# Patient Record
Sex: Female | Born: 1970
Health system: Southern US, Community
[De-identification: ages and names within clinical notes are randomized; demographics above are authoritative.]

## PROBLEM LIST (undated history)

## (undated) DIAGNOSIS — N200 Calculus of kidney: Secondary | ICD-10-CM

## (undated) DIAGNOSIS — K219 Gastro-esophageal reflux disease without esophagitis: Secondary | ICD-10-CM

## (undated) DIAGNOSIS — K2970 Gastritis, unspecified, without bleeding: Secondary | ICD-10-CM

## (undated) DIAGNOSIS — F32A Depression, unspecified: Secondary | ICD-10-CM

## (undated) DIAGNOSIS — Z87898 Personal history of other specified conditions: Secondary | ICD-10-CM

## (undated) HISTORY — DX: Gastro-esophageal reflux disease without esophagitis: K21.9

## (undated) HISTORY — DX: Calculus of kidney: N20.0

## (undated) HISTORY — PX: BUNIONECTOMY: SHX129

## (undated) HISTORY — PX: AUGMENTATION MAMMAPLASTY: SUR837

## (undated) HISTORY — DX: Gastritis, unspecified, without bleeding: K29.70

## (undated) HISTORY — DX: Depression, unspecified: F32.A

---

## 1993-12-13 HISTORY — PX: LAPAROSCOPIC CHOLECYSTECTOMY: SUR755

## 1999-03-02 ENCOUNTER — Other Ambulatory Visit: Admission: RE | Admit: 1999-03-02 | Discharge: 1999-03-02 | Payer: Self-pay | Admitting: Obstetrics and Gynecology

## 1999-03-30 ENCOUNTER — Ambulatory Visit (HOSPITAL_COMMUNITY): Admission: RE | Admit: 1999-03-30 | Discharge: 1999-03-30 | Payer: Self-pay | Admitting: Gynecology

## 2000-06-09 ENCOUNTER — Encounter: Payer: Self-pay | Admitting: Gynecology

## 2000-06-09 ENCOUNTER — Encounter: Admission: RE | Admit: 2000-06-09 | Discharge: 2000-06-09 | Payer: Self-pay | Admitting: Gynecology

## 2000-08-19 ENCOUNTER — Inpatient Hospital Stay (HOSPITAL_COMMUNITY): Admission: AD | Admit: 2000-08-19 | Discharge: 2000-08-19 | Payer: Self-pay | Admitting: Gynecology

## 2000-10-06 ENCOUNTER — Inpatient Hospital Stay (HOSPITAL_COMMUNITY): Admission: AD | Admit: 2000-10-06 | Discharge: 2000-10-09 | Payer: Self-pay | Admitting: Gynecology

## 2002-06-21 ENCOUNTER — Encounter: Payer: Self-pay | Admitting: Family Medicine

## 2002-06-21 ENCOUNTER — Encounter: Admission: RE | Admit: 2002-06-21 | Discharge: 2002-06-21 | Payer: Self-pay | Admitting: Family Medicine

## 2004-10-17 ENCOUNTER — Emergency Department (HOSPITAL_COMMUNITY): Admission: EM | Admit: 2004-10-17 | Discharge: 2004-10-17 | Payer: Self-pay | Admitting: Family Medicine

## 2004-10-22 ENCOUNTER — Ambulatory Visit: Payer: Self-pay | Admitting: Gastroenterology

## 2004-11-10 ENCOUNTER — Ambulatory Visit: Payer: Self-pay | Admitting: Gastroenterology

## 2007-12-14 HISTORY — PX: BREAST ENHANCEMENT SURGERY: SHX7

## 2013-05-21 ENCOUNTER — Telehealth: Payer: Self-pay | Admitting: Obstetrics & Gynecology

## 2013-05-22 ENCOUNTER — Encounter: Payer: Self-pay | Admitting: Obstetrics & Gynecology

## 2013-05-22 ENCOUNTER — Ambulatory Visit (INDEPENDENT_AMBULATORY_CARE_PROVIDER_SITE_OTHER): Payer: BC Managed Care – PPO | Admitting: Obstetrics & Gynecology

## 2013-05-22 ENCOUNTER — Other Ambulatory Visit: Payer: Self-pay | Admitting: Obstetrics & Gynecology

## 2013-05-22 VITALS — BP 102/62 | HR 64 | Resp 12 | Ht 66.5 in | Wt 164.0 lb

## 2013-05-22 DIAGNOSIS — Z124 Encounter for screening for malignant neoplasm of cervix: Secondary | ICD-10-CM

## 2013-05-22 DIAGNOSIS — Z202 Contact with and (suspected) exposure to infections with a predominantly sexual mode of transmission: Secondary | ICD-10-CM

## 2013-05-22 DIAGNOSIS — Z23 Encounter for immunization: Secondary | ICD-10-CM

## 2013-05-22 DIAGNOSIS — Z01419 Encounter for gynecological examination (general) (routine) without abnormal findings: Secondary | ICD-10-CM

## 2013-05-22 DIAGNOSIS — Z0000 Encounter for general adult medical examination without abnormal findings: Secondary | ICD-10-CM

## 2013-05-22 DIAGNOSIS — Z2089 Contact with and (suspected) exposure to other communicable diseases: Secondary | ICD-10-CM

## 2013-05-22 LAB — POCT URINALYSIS DIPSTICK
Bilirubin, UA: NEGATIVE
Glucose, UA: NEGATIVE
Ketones, UA: NEGATIVE
Nitrite, UA: NEGATIVE
Urobilinogen, UA: NEGATIVE
pH, UA: 7

## 2013-05-22 NOTE — Progress Notes (Signed)
42 y.o. Shelly Blackwell DivorcedCaucasianF here for annual exam.  Cycles are regular.  Wants to do blood work today.  Did have an abnormal MMG in the past.  Dense breast with a cyst.  Follow-up was normal.  Hasn't had in about two years.   Patient's last menstrual period was 05/12/2013.          Sexually active: yes  The current method of family planning is vasectomy (current partner) Exercising: yes  walking Smoker:  yes  Health Maintenance: Pap:  11/12  History of abnormal Pap:  no  MMG:  11/12 Colonoscopy:  none BMD:   none TDaP:  ? Screening Labs: today, Urine today: WBC-trace, PROTEIN-trace, RBC-trace   reports that she has been smoking Cigarettes.  She has been smoking about 0.50 packs per day. She has never used smokeless tobacco. She reports that she does not drink alcohol or use illicit drugs.  Past Medical History  Diagnosis Date  . Kidney stones     h/o-no surgery    Past Surgical History  Procedure Laterality Date  . Cesarean section      x2  . Cholecystectomy    . Breast enhancement surgery    . Bunionectomy      left foot    Current Outpatient Prescriptions  Medication Sig Dispense Refill  . aspirin 81 MG tablet Take 81 mg by mouth daily.      . fish oil-omega-3 fatty acids 1000 MG capsule Take 2 g by mouth daily.      . Multiple Vitamins-Minerals (MULTIVITAMIN PO) Take by mouth daily.      . Probiotic Product (PROBIOTIC DAILY PO) Take by mouth daily.       No current facility-administered medications for this visit.    Family History  Problem Relation Age of Onset  . Hepatitis Mother   . Liver cancer Mother   . Cirrhosis Mother     liver transplant  . Kidney Stones Mother   . Cancer Paternal Grandmother     stomach  . Alcoholism Paternal Grandfather   . Alcoholism Maternal Grandfather     ROS:  Pertinent items are noted in HPI.  Otherwise, a comprehensive ROS was negative.  Exam:   BP 102/62  Pulse 64  Resp 12  Ht 5' 6.5" (1.689 m)  Wt 164 lb  (74.39 kg)  BMI 26.08 kg/m2  LMP 05/12/2013    Height: 5' 6.5" (168.9 cm)  Ht Readings from Last 3 Encounters:  05/22/13 5' 6.5" (1.689 m)    General appearance: alert, cooperative and appears stated age Head: Normocephalic, without obvious abnormality, atraumatic Neck: no adenopathy, supple, symmetrical, trachea midline and thyroid normal to inspection and palpation Lungs: clear to auscultation bilaterally Breasts: normal appearance, no masses or tenderness, bilateral implants Heart: regular rate and rhythm Abdomen: soft, non-tender; bowel sounds normal; no masses,  no organomegaly Extremities: extremities normal, atraumatic, no cyanosis or edema Skin: Skin color, texture, turgor normal. No rashes or lesions Lymph nodes: Cervical, supraclavicular, and axillary nodes normal. No abnormal inguinal nodes palpated Neurologic: Grossly normal   Pelvic: External genitalia:  no lesions              Urethra:  normal appearing urethra with no masses, tenderness or lesions              Bartholins and Skenes: normal                 Vagina: normal appearing vagina with normal color and discharge, no lesions  Cervix: no lesions              Pap taken: yes Bimanual Exam:  Uterus:  normal size, contour, position, consistency, mobility, non-tender              Adnexa: normal adnexa and no mass, fullness, tenderness               Rectovaginal: Confirms               Anus:  normal sphincter tone, no lesions  A:  Well Woman with normal exam Smoker Desires STD testing  P:   Mammogram recommended yearly.  3D d/w pt pap smear with hr HPV Gc/Chl and HIV FLP, CMP, TSH, Vit D Tdap given today return annually or prn  An After Visit Summary was printed and given to the patient.

## 2013-05-22 NOTE — Patient Instructions (Addendum)

## 2013-05-23 ENCOUNTER — Encounter: Payer: Self-pay | Admitting: Obstetrics & Gynecology

## 2013-05-23 ENCOUNTER — Other Ambulatory Visit: Payer: Self-pay

## 2013-05-23 DIAGNOSIS — Z1231 Encounter for screening mammogram for malignant neoplasm of breast: Secondary | ICD-10-CM

## 2013-05-23 LAB — LIPID PANEL
Cholesterol: 105 mg/dL (ref 0–200)
HDL: 50 mg/dL (ref >39)
LDL Cholesterol: 45 mg/dL (ref 0–99)
Triglycerides: 49 mg/dL (ref <150)
VLDL: 10 mg/dL (ref 0–40)

## 2013-05-23 LAB — COMPREHENSIVE METABOLIC PANEL
AST: 14 U/L (ref 0–37)
Albumin: 4.3 g/dL (ref 3.5–5.2)
Alkaline Phosphatase: 57 U/L (ref 39–117)
BUN: 16 mg/dL (ref 6–23)
CO2: 27 mEq/L (ref 19–32)
Chloride: 104 mEq/L (ref 96–112)
Creat: 0.76 mg/dL (ref 0.50–1.10)
Glucose, Bld: 92 mg/dL (ref 70–99)
Potassium: 4.1 mEq/L (ref 3.5–5.3)
Total Bilirubin: 0.4 mg/dL (ref 0.3–1.2)
Total Protein: 6.7 g/dL (ref 6.0–8.3)

## 2013-05-23 LAB — HIV ANTIBODY (ROUTINE TESTING W REFLEX): HIV: NONREACTIVE

## 2013-05-23 LAB — TSH: TSH: 1.123 u[IU]/mL (ref 0.350–4.500)

## 2013-05-23 LAB — VITAMIN D 25 HYDROXY (VIT D DEFICIENCY, FRACTURES): Vit D, 25-Hydroxy: 58 ng/mL (ref 30–89)

## 2013-05-23 NOTE — Addendum Note (Signed)
Addended by: Clide Dales R on: 05/23/2013 11:09 AM   Modules accepted: Orders

## 2013-05-24 ENCOUNTER — Telehealth: Payer: Self-pay

## 2013-05-24 NOTE — Telephone Encounter (Signed)
Message copied by Alisa Graff on Thu May 24, 2013  5:38 PM ------      Message from: Jerene Bears      Created: Wed May 23, 2013  4:00 PM       Inform labs nl. ------

## 2013-05-24 NOTE — Telephone Encounter (Signed)
6/12 lmtcb//kn

## 2013-05-24 NOTE — Telephone Encounter (Signed)
Patient notified of all results by Kandis Cocking, RN//kn

## 2013-05-24 NOTE — Telephone Encounter (Signed)
Patient notified of all lab results that are back at this point.  GC/Chl and pap still pending but we will call with these.

## 2013-05-25 LAB — IPS PAP TEST WITH HPV

## 2013-06-19 ENCOUNTER — Encounter (HOSPITAL_COMMUNITY): Payer: Self-pay | Admitting: Emergency Medicine

## 2013-06-19 ENCOUNTER — Emergency Department (HOSPITAL_COMMUNITY)
Admission: EM | Admit: 2013-06-19 | Discharge: 2013-06-20 | Disposition: A | Discharge status: Home / Self Care | Payer: BC Managed Care – PPO | Attending: Emergency Medicine | Admitting: Emergency Medicine

## 2013-06-19 DIAGNOSIS — R002 Palpitations: Secondary | ICD-10-CM | POA: Insufficient documentation

## 2013-06-19 DIAGNOSIS — I4949 Other premature depolarization: Secondary | ICD-10-CM | POA: Insufficient documentation

## 2013-06-19 DIAGNOSIS — I493 Ventricular premature depolarization: Secondary | ICD-10-CM

## 2013-06-19 DIAGNOSIS — H538 Other visual disturbances: Secondary | ICD-10-CM | POA: Insufficient documentation

## 2013-06-19 DIAGNOSIS — R42 Dizziness and giddiness: Secondary | ICD-10-CM | POA: Insufficient documentation

## 2013-06-19 DIAGNOSIS — F17200 Nicotine dependence, unspecified, uncomplicated: Secondary | ICD-10-CM | POA: Insufficient documentation

## 2013-06-19 DIAGNOSIS — R0602 Shortness of breath: Secondary | ICD-10-CM | POA: Insufficient documentation

## 2013-06-19 DIAGNOSIS — Z7982 Long term (current) use of aspirin: Secondary | ICD-10-CM | POA: Insufficient documentation

## 2013-06-19 DIAGNOSIS — Z87442 Personal history of urinary calculi: Secondary | ICD-10-CM | POA: Insufficient documentation

## 2013-06-19 LAB — POCT I-STAT, CHEM 8
Chloride: 104 mEq/L (ref 96–112)
Creatinine, Ser: 0.7 mg/dL (ref 0.50–1.10)
Glucose, Bld: 80 mg/dL (ref 70–99)
Potassium: 3.4 mEq/L — ABNORMAL LOW (ref 3.5–5.1)

## 2013-06-19 NOTE — ED Provider Notes (Signed)
History    CSN: [id number] Arrival date & time 06/19/13  1950  First MD Initiated Contact with Patient 06/19/13 2129     Chief Complaint  Patient presents with  . Palpitations   (Consider location/radiation/quality/duration/timing/severity/associated sxs/prior Treatment) HPI Pt is a 42yo female presenting today with new onset of palpitations that started yesterday, worsened this morning.  Pt describes palpitations as heart "stopping" then resuming very "abruptly associated with mild dizziness, lightheadedness, and mild change in vision.  Pt denies any known cardiac or pulmonary problems.  Pt reports typically drinking 3-4 cups of coffee/day however cut down to 1 cup today but was having symptoms prior to coffee earlier this morning.  Pt states she felt about 20 episodes in a period of palpitations earlier today.  Denies any new medications or recent stress in her life.    Past Medical History  Diagnosis Date  . Kidney stones     h/o-no surgery   Past Surgical History  Procedure Laterality Date  . Cesarean section  1994, 2001    x2  . Laparoscopic cholecystectomy  1995  . Breast enhancement surgery  2009  . Bunionectomy  1989, 1998    left foot   Family History  Problem Relation Age of Onset  . Hepatitis Mother   . Liver cancer Mother   . Cirrhosis Mother     liver transplant  . Kidney Stones Mother   . Cancer Paternal Grandmother     stomach  . Alcoholism Paternal Grandfather   . Alcoholism Maternal Grandfather    History  Substance Use Topics  . Smoking status: Light Tobacco Smoker -- 0.50 packs/day    Types: Cigarettes  . Smokeless tobacco: Never Used  . Alcohol Use: No   OB History   Grav Para Term Preterm Abortions TAB SAB Ect Mult Living   2 2 2       2      Review of Systems  Constitutional: Negative for chills, diaphoresis and fatigue.  Eyes: Positive for visual disturbance ( mild blurred). Negative for photophobia.  Respiratory: Positive for  shortness of breath. Negative for cough.   Cardiovascular: Positive for palpitations. Negative for chest pain.  Gastrointestinal: Negative for nausea, vomiting and diarrhea.  Neurological: Positive for light-headedness. Negative for dizziness and headaches.  All other systems reviewed and are negative.    Allergies  Nickel  Home Medications   Current Outpatient Rx  Name  Route  Sig  Dispense  Refill  . aspirin 81 MG tablet   Oral   Take 81 mg by mouth daily.         . fish oil-omega-3 fatty acids 1000 MG capsule   Oral   Take 2 g by mouth daily.         . Multiple Vitamins-Minerals (MULTIVITAMIN PO)   Oral   Take by mouth daily.         . Probiotic Product (PROBIOTIC DAILY PO)   Oral   Take by mouth daily.          BP 106/67  Pulse 60  Temp(Src) 98.4 F (36.9 C)  Resp 22  SpO2 98%  LMP 06/02/2013 Physical Exam  Nursing note and vitals reviewed. Constitutional: She appears well-developed and well-nourished. No distress.  Pt sitting comfortably in exam bed, NAD.    HENT:  Head: Normocephalic and atraumatic.  Eyes: Conjunctivae are normal. No scleral icterus.  Neck: Normal range of motion. Neck supple.  Cardiovascular: Normal rate, regular rhythm and normal  heart sounds.   Pulmonary/Chest: Effort normal and breath sounds normal. No respiratory distress. She has no wheezes. She has no rales. She exhibits no tenderness.  Abdominal: Soft. Bowel sounds are normal. She exhibits no distension and no mass. There is no tenderness. There is no rebound and no guarding.  Musculoskeletal: Normal range of motion.  Neurological: She is alert.  Skin: Skin is warm and dry. She is not diaphoretic.    ED Course  Procedures (including critical care time) Labs Reviewed  POCT I-STAT, CHEM 8 - Abnormal; Notable for the following:    Potassium 3.4 (*)    All other components within normal limits   No results found.   Date: 06/19/2013  Rate: 91  Rhythm: normal sinus  rhythm  QRS Axis: normal  Intervals: normal  ST/T Wave abnormalities: nonspecific ST changes  Conduction Disutrbances:none  Narrative Interpretation:   Old EKG Reviewed: none available   1. Heart palpitations   2. PVC (premature ventricular contraction)     MDM  Likely benign palpitations will likely have pt f/u with cardiology  Consulted Dr. Jon Billings with [facility] who agreed to have pt seen in office tomorrow morning to have heart monitor placed. Advised pt to call tomorrow afternoon if she still has not heard from office. Return precautions given. Pt verbalized understanding and agreement with tx plan. Vitals: unremarkable. Discharged in stable condition.    Discussed pt with attending during ED encounter.      Junius Finner, PA-C 06/20/13 0120

## 2013-06-19 NOTE — ED Notes (Signed)
PT. REPORTS INTERMITTENT PALPITATIONS ONSET YESTERDAY AND AGAIN THIS MORNING WITH SLIGHT SOB  AND MILD DIZZINESS , DENIES CHEST PAIN , NO NAUSEA OR DIAPHORESIS.

## 2013-06-20 NOTE — ED Provider Notes (Signed)
Medical screening examination/treatment/procedure(s) were conducted as a shared visit with non-physician practitioner(s) and myself.  I personally evaluated the patient during the encounter  Delta Deshmukh, MD 06/20/13 0145 

## 2013-06-20 NOTE — ED Provider Notes (Signed)
Patient reports feeling extra heartbeats onset yesterday with slight chest discomfort. And lightheadedness. She has cut back on her caffeine intake since yesterday. She feels much improved presently. Extra heartbeats lasts a split second at a time. Patient noted to have PVCs on the cardiac monitor while in the ED, which she recognizes and can feel.  Doug Sou, MD 06/20/13 [phone]

## 2013-06-21 ENCOUNTER — Encounter (INDEPENDENT_AMBULATORY_CARE_PROVIDER_SITE_OTHER): Payer: BC Managed Care – PPO

## 2013-06-21 ENCOUNTER — Other Ambulatory Visit: Payer: Self-pay | Admitting: Nurse Practitioner

## 2013-06-21 ENCOUNTER — Encounter: Payer: Self-pay | Admitting: *Deleted

## 2013-06-21 DIAGNOSIS — I493 Ventricular premature depolarization: Secondary | ICD-10-CM

## 2013-06-21 DIAGNOSIS — R002 Palpitations: Secondary | ICD-10-CM

## 2013-06-21 DIAGNOSIS — I4949 Other premature depolarization: Secondary | ICD-10-CM

## 2013-06-21 NOTE — Progress Notes (Signed)
Patient ID: Shelly Blackwell, female   DOB: 1971/07/04, 42 y.o.   MRN: [id number] E-Cardio verite 30 day event monitor applied to patient.

## 2013-06-29 ENCOUNTER — Ambulatory Visit
Admission: RE | Admit: 2013-06-29 | Discharge: 2013-06-29 | Disposition: A | Discharge status: Home / Self Care | Payer: BC Managed Care – PPO | Source: Ambulatory Visit

## 2013-06-29 DIAGNOSIS — Z1231 Encounter for screening mammogram for malignant neoplasm of breast: Secondary | ICD-10-CM

## 2013-07-06 ENCOUNTER — Other Ambulatory Visit: Payer: Self-pay | Admitting: Obstetrics & Gynecology

## 2013-07-06 DIAGNOSIS — R928 Other abnormal and inconclusive findings on diagnostic imaging of breast: Secondary | ICD-10-CM

## 2013-07-09 ENCOUNTER — Encounter: Payer: Self-pay | Admitting: Obstetrics & Gynecology

## 2013-07-23 ENCOUNTER — Ambulatory Visit
Admission: RE | Admit: 2013-07-23 | Discharge: 2013-07-23 | Disposition: A | Discharge status: Home / Self Care | Payer: BC Managed Care – PPO | Source: Ambulatory Visit | Attending: Obstetrics & Gynecology | Admitting: Obstetrics & Gynecology

## 2013-07-23 DIAGNOSIS — R928 Other abnormal and inconclusive findings on diagnostic imaging of breast: Secondary | ICD-10-CM

## 2013-08-09 ENCOUNTER — Telehealth: Payer: Self-pay | Admitting: *Deleted

## 2013-08-09 NOTE — Telephone Encounter (Signed)
Pt called about follow up after wearing 30 day monitor. Appointment made for f/u with Dr Graciela Husbands on 08/20/13

## 2013-08-16 ENCOUNTER — Encounter: Payer: Self-pay | Admitting: *Deleted

## 2013-08-20 ENCOUNTER — Institutional Professional Consult (permissible substitution): Payer: BC Managed Care – PPO | Admitting: Internal Medicine

## 2013-09-14 ENCOUNTER — Ambulatory Visit (INDEPENDENT_AMBULATORY_CARE_PROVIDER_SITE_OTHER): Payer: BC Managed Care – PPO | Admitting: Internal Medicine

## 2013-09-14 ENCOUNTER — Encounter: Payer: Self-pay | Admitting: Internal Medicine

## 2013-09-14 VITALS — BP 121/80 | HR 70 | Ht 67.0 in | Wt 154.0 lb

## 2013-09-14 DIAGNOSIS — I493 Ventricular premature depolarization: Secondary | ICD-10-CM | POA: Insufficient documentation

## 2013-09-14 DIAGNOSIS — R002 Palpitations: Secondary | ICD-10-CM

## 2013-09-14 DIAGNOSIS — I4949 Other premature depolarization: Secondary | ICD-10-CM

## 2013-09-14 MED ORDER — VERAPAMIL HCL 40 MG PO TABS: ORAL_TABLET | ORAL | Status: DC

## 2013-09-14 NOTE — Patient Instructions (Addendum)
Your physicain has recommended that you have a signal average EKG  Your physician has requested that you have an exercise tolerance test. For further information please visit [email or web address] Please also follow instruction sheet, as given.   (with Rick Duff PAC or Tereso Newcomer [facility])  Your physician has requested that you have an echocardiogram. Echocardiography is a painless test that uses sound waves to create images of your heart. It provides your doctor with information about the size and shape of your heart and how well your heart's chambers and valves are working. This procedure takes approximately one hour. There are no restrictions for this procedure.   Your physician has recommended you make the following change in your medication:  1) Start Verapamil 40mg  - take as needed for palpitations, may repeat in 30 minutes times one time

## 2013-09-14 NOTE — Progress Notes (Signed)
ELECTROPHYSIOLOGY CONSULT NOTE  Patient ID: Shelly Blackwell, MRN: [id number], DOB/AGE: [birth year] 42 y.o. Admit date: (Not on file) Date of Consult: 09/14/2013  Primary Physician: No PCP Per Patient Primary Cardiologist: new  Chief Complaint:  PVCs   HPI Shelly Blackwell is a 42 y.o. female  Referred from the emergency room because of palpitations which she diagnosed on her own correctly as PVCs and which were corroborated by monitoring arranged following her visit to the emergency room earlier this summer. She has symptoms of palpitations with these and in particular some lightheadedness particularly with bigeminy.  She has noted no problems with exercise tolerance.  She has noted no significant association with her caffeine use. There was some modest stress associated with the events.  She has reasonable exercise tolerance. She is not currently exercising. she smokes.     Past Medical History  Diagnosis Date  . Kidney stones     h/o-no surgery      Surgical History:  Past Surgical History  Procedure Laterality Date  . Cesarean section  1994, 2001    x2  . Laparoscopic cholecystectomy  1995  . Breast enhancement surgery  2009  . Bunionectomy  1989, 1998    left foot     Home Meds: Prior to Admission medications   Medication Sig Start Date End Date Taking? Authorizing Provider  fish oil-omega-3 fatty acids 1000 MG capsule Take 2 g by mouth daily.   Yes Historical Provider, MD  Multiple Vitamins-Minerals (MULTIVITAMIN PO) Take by mouth daily.   Yes Historical Provider, MD  Probiotic Product (PROBIOTIC DAILY PO) Take by mouth daily.   Yes Historical Provider, MD     Allergies:  Allergies  Allergen Reactions  . Nickel     unknown    History   Social History  . Marital Status: Divorced    Spouse Name: N/A    Number of Children: N/A  . Years of Education: N/A   Occupational History  . Not on file.   Social History Main Topics  . Smoking status: Light  Tobacco Smoker -- 0.50 packs/day    Types: Cigarettes  . Smokeless tobacco: Never Used  . Alcohol Use: No  . Drug Use: No  . Sexual Activity: Not on file     Comment: vasectomy   Other Topics Concern  . Not on file   Social History Narrative  . No narrative on file     Family History  Problem Relation Age of Onset  . Hepatitis Mother   . Liver cancer Mother   . Cirrhosis Mother     liver transplant  . Kidney Stones Mother   . Cancer Paternal Grandmother     stomach  . Alcoholism Paternal Grandfather   . Alcoholism Maternal Grandfather      ROS:  Please see the history of present illness.     All other systems reviewed and negative.    Physical Exam: BP 121/80  Pulse 70  Ht 5\' 7"  (1.702 m)  Wt 154 lb (69.854 kg)  BMI 24.11 kg/m2  Alert and oriented in no acute distress HENT- normal Eyes- EOMI, without scleral icterus Skin- warm and dry; without rashes LN-neg Neck- supple without thyromegaly, JVP-flat, carotids brisk and full without bruits Back-without CVAT or kyphosis Lungs-clear to auscultation CV-Regular rate and rhythm, nl S1 and S2, no murmurs gallops or rubs, S4-absent Abd-soft with active bowel sounds; no midline pulsation or hepatomegaly Pulses-intact femoral and distal MKS-without gross deformity Neuro- Ax O,  CN3-12 intact, grossly normal motor and sensory function Affect engaging    Labs: Cardiac Enzymes No results found for this basename: CKTOTAL, CKMB, TROPONINI,  in the last 72 hours CBC Lab Results  Component Value Date   HGB 12.6 06/19/2013   HCT 37.0 06/19/2013    EKG:  Sinus rhythm with normal intervals and no T wave changes Monitor demonstrated PVCs which had been axis perpendicular to her lead 2/V6  suggesting either in origin in the left ventricle or in the inferior part of the heart.    Assessment and Plan:    Shelly Blackwell

## 2013-09-14 NOTE — Assessment & Plan Note (Signed)
The patient has a long-standing history of symptomatic PVCs that recently have worsened area they have gradually resolved again. The morphology suggests an origin from the inferior wall or the left ventricle. Given her atypical morphology, we'll undertake an evaluation to exclude structural heart disease. We will pursue a exercise test, signal average ECG and 2-D echo.  In addition we have given a prescription for verapamil to take on an as-needed basis, using verapamil because of the expected site of origin

## 2013-09-17 ENCOUNTER — Other Ambulatory Visit: Payer: Self-pay | Admitting: *Deleted

## 2013-09-17 ENCOUNTER — Encounter: Payer: Self-pay | Admitting: Internal Medicine

## 2013-09-17 MED ORDER — VERAPAMIL HCL 40 MG PO TABS: ORAL_TABLET | ORAL | Status: DC

## 2013-09-19 ENCOUNTER — Ambulatory Visit (HOSPITAL_COMMUNITY)
Admission: RE | Admit: 2013-09-19 | Discharge: 2013-09-19 | Disposition: A | Discharge status: Home / Self Care | Payer: BC Managed Care – PPO | Source: Ambulatory Visit | Attending: Internal Medicine | Admitting: Internal Medicine

## 2013-09-19 DIAGNOSIS — I4949 Other premature depolarization: Secondary | ICD-10-CM | POA: Insufficient documentation

## 2013-10-01 ENCOUNTER — Encounter (INDEPENDENT_AMBULATORY_CARE_PROVIDER_SITE_OTHER): Payer: Self-pay | Admitting: Surgery

## 2013-10-01 ENCOUNTER — Ambulatory Visit (INDEPENDENT_AMBULATORY_CARE_PROVIDER_SITE_OTHER): Payer: BC Managed Care – PPO | Admitting: Surgery

## 2013-10-01 VITALS — BP 100/60 | HR 66 | Temp 98.6°F | Resp 14 | Ht 67.0 in | Wt 152.0 lb

## 2013-10-01 DIAGNOSIS — K432 Incisional hernia without obstruction or gangrene: Secondary | ICD-10-CM | POA: Insufficient documentation

## 2013-10-01 DIAGNOSIS — Z720 Tobacco use: Secondary | ICD-10-CM | POA: Insufficient documentation

## 2013-10-01 DIAGNOSIS — F17200 Nicotine dependence, unspecified, uncomplicated: Secondary | ICD-10-CM

## 2013-10-01 NOTE — Progress Notes (Signed)
Subjective:     Patient ID: Shelly Blackwell, female   DOB: 12-Nov-1971, 42 y.o.   MRN: [id number]  HPI  Shelly Blackwell  [birth year] [id number]  Patient Care Team: No Pcp Per Patient as PCP - General (General Practice)  This patient is a 42 y.o.female who presents today for surgical evaluation at the request of self.   Reason for visit: Umbilical hernia  Woman with history of PVCs followed by cardiology.  Smokes 1/2 PPD.  Has noticed a lump at her bellybutton for the past season.  Worse with cough/Valsalva.  Not severely painful, but can be tender at times.  Worse with sit-ups and moderate exercise.  It concerned her.  She wished to be evaluated.  Had a laparoscopic cholecystectomy in the 1990s.  Exercises regularly.    No exertional chest/neck/shoulder/arm pain.  Patient can walk 120 minutes for about 5 miles without difficulty.   BM daily.  No history of MRSA or other skin infections  Patient Active Problem List   Diagnosis Date Noted  . PVC's (premature ventricular contractions) 09/14/2013    Past Medical History  Diagnosis Date  . Kidney stones     h/o-no surgery    Past Surgical History  Procedure Laterality Date  . Cesarean section  1994, 2001    x2  . Laparoscopic cholecystectomy  1995  . Breast enhancement surgery  2009  . Bunionectomy  1989, 1998    left foot    History   Social History  . Marital Status: Divorced    Spouse Name: N/A    Number of Children: N/A  . Years of Education: N/A   Occupational History  . Not on file.   Social History Main Topics  . Smoking status: Light Tobacco Smoker -- 0.50 packs/day    Types: Cigarettes  . Smokeless tobacco: Never Used  . Alcohol Use: No  . Drug Use: No  . Sexual Activity: Not on file     Comment: vasectomy   Other Topics Concern  . Not on file   Social History Narrative  . No narrative on file    Family History  Problem Relation Age of Onset  . Hepatitis Mother   . Liver cancer Mother   .  Cirrhosis Mother     liver transplant  . Kidney Stones Mother   . Cancer Paternal Grandmother     stomach  . Alcoholism Paternal Grandfather   . Alcoholism Maternal Grandfather     Current Outpatient Prescriptions  Medication Sig Dispense Refill  . fish oil-omega-3 fatty acids 1000 MG capsule Take 2 g by mouth daily.      . Multiple Vitamins-Minerals (MULTIVITAMIN PO) Take by mouth daily.      . Probiotic Product (PROBIOTIC DAILY PO) Take by mouth daily.      . verapamil (CALAN) 40 MG tablet Take one tablet as needed for palpitations. Can repeat in 30 minutes x once.  20 tablet  0   No current facility-administered medications for this visit.     Allergies  Allergen Reactions  . Nickel     unknown    BP 100/60  Pulse 66  Temp(Src) 98.6 F (37 C) (Temporal)  Resp 14  Ht 5\' 7"  (1.702 m)  Wt 152 lb (68.947 kg)  BMI 23.8 kg/m2  No results found.   Review of Systems  Constitutional: Negative for fever, chills, diaphoresis, appetite change and fatigue.  HENT: Negative for ear discharge, ear pain, sore throat and trouble swallowing.  Eyes: Negative for photophobia, discharge and visual disturbance.  Respiratory: Negative for cough, choking, chest tightness and shortness of breath.   Cardiovascular: Negative for chest pain and palpitations.  Gastrointestinal: Negative for nausea, vomiting, abdominal pain, diarrhea, constipation, anal bleeding and rectal pain.  Endocrine: Negative for cold intolerance and heat intolerance.  Genitourinary: Negative for dysuria, frequency and difficulty urinating.  Musculoskeletal: Negative for gait problem, myalgias and neck pain.  Skin: Negative for color change, pallor and rash.  Allergic/Immunologic: Negative for environmental allergies, food allergies and immunocompromised state.  Neurological: Negative for dizziness, speech difficulty, weakness and numbness.  Hematological: Negative for adenopathy.  Psychiatric/Behavioral: Negative for  confusion and agitation. The patient is not nervous/anxious.        Objective:   Physical Exam  Constitutional: She is oriented to person, place, and time. She appears well-developed and well-nourished. No distress.  HENT:  Head: Normocephalic.  Mouth/Throat: Oropharynx is clear and moist. No oropharyngeal exudate.  Eyes: Conjunctivae and EOM are normal. Pupils are equal, round, and reactive to light. No scleral icterus.  Neck: Normal range of motion. Neck supple. No tracheal deviation present.  Cardiovascular: Normal rate, regular rhythm and intact distal pulses.   Pulmonary/Chest: Effort normal and breath sounds normal. No stridor. No respiratory distress. She exhibits no tenderness.  Abdominal: Soft. She exhibits no distension and no mass. There is no rigidity, no rebound, no guarding, no tenderness at McBurney's point and negative Murphy's sign. A hernia is present. Hernia confirmed positive in the ventral area. Hernia confirmed negative in the right inguinal area and confirmed negative in the left inguinal area.    Genitourinary: No vaginal discharge found.  Musculoskeletal: Normal range of motion. She exhibits no tenderness.       Right elbow: She exhibits normal range of motion.       Left elbow: She exhibits normal range of motion.       Right wrist: She exhibits normal range of motion.       Left wrist: She exhibits normal range of motion.       Right hand: Normal strength noted.       Left hand: Normal strength noted.  Lymphadenopathy:       Head (right side): No posterior auricular adenopathy present.       Head (left side): No posterior auricular adenopathy present.    She has no cervical adenopathy.    She has no axillary adenopathy.       Right: No inguinal adenopathy present.       Left: No inguinal adenopathy present.  Neurological: She is alert and oriented to person, place, and time. No cranial nerve deficit. She exhibits normal muscle tone. Coordination normal.    Skin: Skin is warm and dry. No rash noted. She is not diaphoretic. No erythema.  Psychiatric: She has a normal mood and affect. Her behavior is normal. Judgment and thought content normal.       Assessment:     Incisional hernia at umbilicus.     Plan:     I think this is small enough that She has options.   I offered observation only Since or risk of incarceration a small given its small size.  Because it is getting more symptomatic, surgical repair is reasonable.  I think that all she requires is a primary repair with stitches.  Because it is rather sensitive at times, she wishes to deal with that now while and is small:  The anatomy & physiology of the  abdominal wall was discussed.  The pathophysiology of hernias was discussed.  Natural history risks without surgery including progeressive enlargement, pain, incarceration & strangulation was discussed.   Contributors to complications such as smoking, obesity, diabetes, prior surgery, etc were discussed.   I feel the risks of no intervention will lead to serious problems that outweigh the operative risks; therefore, I recommended surgery to reduce and repair the hernia.  I explained laparoscopic techniques with probable need for an open approach.  I noted the probable use of mesh to patch and/or buttress the hernia repair  Risks such as bleeding, infection, abscess, need for further treatment, heart attack, death, and other risks were discussed.  I noted a good likelihood this will help address the problem.   Goals of post-operative recovery were discussed as well.  Possibility that this will not correct all symptoms was explained.  I stressed the importance of low-impact activity, aggressive pain control, avoiding constipation, & not pushing through pain to minimize risk of post-operative chronic pain or injury. Possibility of reherniation especially with smoking, obesity, diabetes, immunosuppression, and other health conditions was discussed.   We will work to minimize complications.     An educational handout further explaining the pathology & treatment options was given as well.  Questions were answered.  The patient expresses understanding & wishes to proceed with surgery.  Stop smoking.  We talked to the patient about the dangers of smoking.  We stressed that tobacco use dramatically increases the risk of peri-operative complications such as infection, tissue necrosis leaving to problems with incision/wound and organ healing, heart attack, stroke, DVT, pulmonary embolism, and death.  We noted there are programs in our community to help stop smoking.

## 2013-10-01 NOTE — Patient Instructions (Signed)
See the Handout(s) we gave you.  Consider surgery To fix the abdominal wall hernia at your bellybutton.  Please call our office at [phone] if you wish to schedule surgery or if you have further questions / concerns.   HERNIA REPAIR: POST OP INSTRUCTIONS  1. DIET: Follow a light bland diet the first 24 hours after arrival home, such as soup, liquids, crackers, etc.  Be sure to include lots of fluids daily.  Avoid fast food or heavy meals as your are more likely to get nauseated.  Eat a low fat the next few days after surgery. 2. Take your usually prescribed home medications unless otherwise directed. 3. PAIN CONTROL: a. Pain is best controlled by a usual combination of three different methods TOGETHER: i. Ice/Heat ii. Over the counter pain medication iii. Prescription pain medication b. Most patients will experience some swelling and bruising around the hernia(s) such as the bellybutton, groins, or old incisions.  Ice packs or heating pads (30-60 minutes up to 6 times a day) will help. Use ice for the first few days to help decrease swelling and bruising, then switch to heat to help relax tight/sore spots and speed recovery.  Some people prefer to use ice alone, heat alone, alternating between ice & heat.  Experiment to what works for you.  Swelling and bruising can take several weeks to resolve.   c. It is helpful to take an over-the-counter pain medication regularly for the first few weeks.  Choose one of the following that works best for you: i. Naproxen (Aleve, etc)  Two 220mg  tabs twice a day ii. Ibuprofen (Advil, etc) Three 200mg  tabs four times a day (every meal & bedtime) iii. Acetaminophen (Tylenol, etc) 325-650mg  four times a day (every meal & bedtime) d. A  prescription for pain medication should be given to you upon discharge.  Take your pain medication as prescribed.  i. If you are having problems/concerns with the prescription medicine (does not control pain, nausea, vomiting,  rash, itching, etc), please call us [phone] to see if we need to switch you to a different pain medicine that will work better for you and/or control your side effect better. ii. If you need a refill on your pain medication, please contact your pharmacy.  They will contact our office to request authorization. Prescriptions will not be filled after 5 pm or on week-ends. 4. Avoid getting constipated.  Between the surgery and the pain medications, it is common to experience some constipation.  Increasing fluid intake and taking a fiber supplement (such as Metamucil, Citrucel, FiberCon, MiraLax, etc) 1-2 times a day regularly will usually help prevent this problem from occurring.  A mild laxative (prune juice, Milk of Magnesia, MiraLax, etc) should be taken according to package directions if there are no bowel movements after 48 hours.   5. Wash / shower every day.  You may shower over the dressings as they are waterproof.   6. Remove your waterproof bandages 5 days after surgery.  You may leave the incision open to air.  You may replace a dressing/Band-Aid to cover the incision for comfort if you wish.  Continue to shower over incision(s) after the dressing is off.    7. ACTIVITIES as tolerated:   a. You may resume regular (light) daily activities beginning the next day-such as daily self-care, walking, climbing stairs-gradually increasing activities as tolerated.  If you can walk 30 minutes without difficulty, it is safe to try more intense activity such as jogging,  treadmill, bicycling, low-impact aerobics, swimming, etc. b. Save the most intensive and strenuous activity for last such as sit-ups, heavy lifting, contact sports, etc  Refrain from any heavy lifting or straining until you are off narcotics for pain control.   c. DO NOT PUSH THROUGH PAIN.  Let pain be your guide: If it hurts to do something, don't do it.  Pain is your body warning you to avoid that activity for another week until the  pain goes down. d. You may drive when you are no longer taking prescription pain medication, you can comfortably wear a seatbelt, and you can safely maneuver your car and apply brakes. e. Bonita Quin may have sexual intercourse when it is comfortable.  8. FOLLOW UP in our office a. Please call [facility] at [phone] to set up an appointment to see your surgeon in the office for a follow-up appointment approximately 2-3 weeks after your surgery. b. Make sure that you call for this appointment the day you arrive home to insure a convenient appointment time. 9.  IF YOU HAVE DISABILITY OR FAMILY LEAVE FORMS, BRING THEM TO THE OFFICE FOR PROCESSING.  DO NOT GIVE THEM TO YOUR DOCTOR.  WHEN TO CALL us [phone]: 1. Poor pain control 2. Reactions / problems with new medications (rash/itching, nausea, etc)  3. Fever over 101.5 F (38.5 C) 4. Inability to urinate 5. Nausea and/or vomiting 6. Worsening swelling or bruising 7. Continued bleeding from incision. 8. Increased pain, redness, or drainage from the incision   The clinic staff is available to answer your questions during regular business hours (8:30am-5pm).  Please don't hesitate to call and ask to speak to one of our nurses for clinical concerns.   If you have a medical emergency, go to the nearest emergency room or call 911.  A surgeon from [facility] is always on call at the hospitals in [facility], [place] [address], [place], [place]  [address] ?  [address] [address], [place], [place]   [address] MAIN: [phone] ? TOLL FREE: [phone] ? FAX: [phone] [email or web address]  STOP SMOKING!  We strongly recommend that you stop smoking.  Smoking increases the risk of surgery including infection in the form of an open wound, pus formation, abscess, hernia at an incision on the abdomen, etc.  You have an increased risk of other MAJOR complications such as stroke, heart attack,  forming clots in the leg and/or lungs, and death.    While it can be one of the most difficult things to do, the [place] community has programs to help you stop.  Consider talking with your primary care physician about options.  Also, Smoking Cessation classes are available through the [facility]:  The smoking cessation program is a proven-effective program from the American Lung Association. The program is available for anyone 78 and older who currently smokes. The program lasts for 7 weeks and is 8 sessions. Each class will be approximately 1 1/2 hours. The program is every Tuesday.  All classes are 12-1:30pm and same location.  Event Location Information:  Location: [facility] 2nd Floor Conference Room 2-037; located next to [facility] cross streets: [address] & [facility] Entrance into the [facility] is adjacent to the [facility] main entrance. The conference room is located on the 2nd floor.  Parking Instructions: Visitor parking is adjacent to [employer] main entrance and the Cancer  Center   Call [phone] or check the Classes and Support Groups   [email or web address] the event of inclemet weather please call [phone] or view online at [email or web address]  Hernia A hernia occurs when an internal organ pushes out through a weak spot in the abdominal wall. Hernias most commonly occur in the groin and around the navel. Hernias often can be pushed back into place (reduced). Most hernias tend to get worse over time. Some abdominal hernias can get stuck in the opening (irreducible or incarcerated hernia) and cannot be reduced. An irreducible abdominal hernia which is tightly squeezed into the opening is at risk for impaired blood supply (strangulated hernia). A strangulated hernia is a medical emergency. Because of the risk for an irreducible or strangulated hernia, surgery may  be recommended to repair a hernia. CAUSES   Heavy lifting.  Prolonged coughing.  Straining to have a bowel movement.  A cut (incision) made during an abdominal surgery. HOME CARE INSTRUCTIONS   Bed rest is not required. You may continue your normal activities.  Avoid lifting more than 10 pounds (4.5 kg) or straining.  Cough gently. If you are a smoker it is best to stop. Even the best hernia repair can break down with the continual strain of coughing. Even if you do not have your hernia repaired, a cough will continue to aggravate the problem.  Do not wear anything tight over your hernia. Do not try to keep it in with an outside bandage or truss. These can damage abdominal contents if they are trapped within the hernia sac.  Eat a normal diet.  Avoid constipation. Straining over long periods of time will increase hernia size and encourage breakdown of repairs. If you cannot do this with diet alone, stool softeners may be used. SEEK IMMEDIATE MEDICAL CARE IF:   You have a fever.  You develop increasing abdominal pain.  You feel nauseous or vomit.  Your hernia is stuck outside the abdomen, looks discolored, feels hard, or is tender.  You have any changes in your bowel habits or in the hernia that are unusual for you.  You have increased pain or swelling around the hernia.  You cannot push the hernia back in place by applying gentle pressure while lying down. MAKE SURE YOU:   Understand these instructions.  Will watch your condition.  Will get help right away if you are not doing well or get worse. Document Released: 11/29/2005 Document Revised: 02/21/2012 Document Reviewed: 07/18/2008 [facility] Patient Information 2014 [place], [place].

## 2013-10-02 ENCOUNTER — Other Ambulatory Visit: Payer: Self-pay

## 2013-10-02 ENCOUNTER — Ambulatory Visit (HOSPITAL_COMMUNITY): Payer: BC Managed Care – PPO | Attending: Cardiology

## 2013-10-02 DIAGNOSIS — I472 Ventricular tachycardia: Secondary | ICD-10-CM

## 2013-10-02 DIAGNOSIS — I493 Ventricular premature depolarization: Secondary | ICD-10-CM

## 2013-10-02 DIAGNOSIS — R002 Palpitations: Secondary | ICD-10-CM

## 2013-10-10 ENCOUNTER — Telehealth: Payer: Self-pay | Admitting: Internal Medicine

## 2013-10-10 NOTE — Telephone Encounter (Signed)
New problem    Please call pt with results of Echo.   w # [phone] 4 pm  After  Cell # [phone]  Thanks!

## 2013-10-10 NOTE — Telephone Encounter (Signed)
See echo result note 

## 2013-10-15 ENCOUNTER — Ambulatory Visit (INDEPENDENT_AMBULATORY_CARE_PROVIDER_SITE_OTHER): Payer: BC Managed Care – PPO | Admitting: Physician Assistant

## 2013-10-15 DIAGNOSIS — I4949 Other premature depolarization: Secondary | ICD-10-CM

## 2013-10-15 DIAGNOSIS — I493 Ventricular premature depolarization: Secondary | ICD-10-CM

## 2013-10-15 DIAGNOSIS — R002 Palpitations: Secondary | ICD-10-CM

## 2013-10-15 NOTE — Progress Notes (Signed)
Exercise Treadmill Test  Pre-Exercise Testing Evaluation Rhythm: normal sinus  Rate: 82 bpm     Test  Exercise Tolerance Test Ordering MD: Sherryl Manges, MD  Interpreting MD: Tereso Newcomer PA-C  Unique Test No: 1  Treadmill:  1  Indication for ETT: palpitations  Contraindication to ETT: No   Stress Modality: exercise - treadmill  Cardiac Imaging Performed: non   Protocol: standard Bruce - maximal  Max BP:  164/76  Max MPHR (bpm):  177 85% MPR (bpm):  150  MPHR obtained (bpm):  181 % MPHR obtained:  102  Reached 85% MPHR (min:sec):  7:00 Total Exercise Time (min-sec):  12:00  Workload in METS:  13.4 Borg Scale: 16  Reason ETT Terminated:  patient's desire to stop    ST Segment Analysis At Rest: normal ST segments - no evidence of significant ST depression With Exercise: no evidence of significant ST depression  Other Information Arrhythmia:  No Angina during ETT:  absent (0) Quality of ETT:  diagnostic  ETT Interpretation:  normal - no evidence of ischemia by ST analysis  Comments: Excellent exercise capacity. No chest pain. Normal BP response to exercise. No ST-T changes to suggest ischemia. No exercise induced ventricular arrhythmias.  Recommendations: F/u with Dr. Sherryl Manges as planned. Signed,  Tereso Newcomer, PA-C   10/15/2013 3:25 PM

## 2013-10-16 ENCOUNTER — Encounter: Payer: Self-pay | Admitting: Internal Medicine

## 2013-10-18 ENCOUNTER — Other Ambulatory Visit: Payer: Self-pay

## 2013-10-22 ENCOUNTER — Encounter: Payer: Self-pay | Admitting: Internal Medicine

## 2013-10-24 ENCOUNTER — Other Ambulatory Visit (HOSPITAL_COMMUNITY): Payer: Self-pay | Admitting: Surgery

## 2013-10-24 NOTE — Patient Instructions (Addendum)
19 Shelly Blackwell  10/24/2013   Your procedure is scheduled on:  11/01/13 THURSDAY  Report to [facility] at 1000      AM.  Call this number if you have problems the morning of surgery: [phone]       Remember:   Do not eat food After Midnight. Wednesday NIGHT--- MAY HAVE CLEAR LIQUIDS Thursday MORNING UNTIL 0630 AM-- THEN NOTHING BY MOUTH   Take these medicines the morning of surgery with A SIP OF WATER: NONE   .  Contacts, dentures or partial plates can not be worn to surgery  Leave suitcase in the car. After surgery it may be brought to your room.  For patients admitted to the hospital, checkout time is 11:00 AM day of  discharge.             SPECIAL INSTRUCTIONS- SEE [place] PREPARING FOR SURGERY INSTRUCTION SHEET-     DO NOT WEAR JEWELRY, PIERCINGS, LOTIONS, POWDERS, OR PERFUMES.  WOMEN-- DO NOT SHAVE LEGS OR UNDERARMS FOR 12 HOURS BEFORE SHOWERS. MEN MAY SHAVE FACE.  Patients discharged the day of surgery will not be allowed to drive home. IF going home the day of surgery, you must have a driver and someone to stay with you for the first 24 hours  Name and phone number of your driver:    Mother  [place]  PST [phone]  [phone]                 FAILURE TO FOLLOW THESE INSTRUCTIONS MAY RESULT IN  CANCELLATION   OF YOUR SURGERY                                                  Patient Signature _____________________________

## 2013-10-24 NOTE — Progress Notes (Signed)
EKG 10/15/13 chart, OV Dr Graciela Husbands, eccho  10/14, stress test 11/14 EPIC

## 2013-10-25 ENCOUNTER — Encounter (HOSPITAL_COMMUNITY): Payer: Self-pay | Admitting: Pharmacy Technician

## 2013-10-26 ENCOUNTER — Encounter: Payer: Self-pay | Admitting: Internal Medicine

## 2013-10-29 ENCOUNTER — Encounter (HOSPITAL_COMMUNITY)
Admission: RE | Admit: 2013-10-29 | Discharge: 2013-10-29 | Disposition: A | Discharge status: Home / Self Care | Payer: BC Managed Care – PPO | Source: Ambulatory Visit | Attending: Surgery | Admitting: Surgery

## 2013-10-29 ENCOUNTER — Encounter (HOSPITAL_COMMUNITY): Payer: Self-pay

## 2013-10-29 ENCOUNTER — Ambulatory Visit (HOSPITAL_COMMUNITY)
Admission: RE | Admit: 2013-10-29 | Discharge: 2013-10-29 | Disposition: A | Discharge status: Home / Self Care | Payer: BC Managed Care – PPO | Source: Ambulatory Visit | Attending: Surgery | Admitting: Surgery

## 2013-10-29 DIAGNOSIS — K429 Umbilical hernia without obstruction or gangrene: Secondary | ICD-10-CM | POA: Insufficient documentation

## 2013-10-29 DIAGNOSIS — Z01812 Encounter for preprocedural laboratory examination: Secondary | ICD-10-CM | POA: Insufficient documentation

## 2013-10-29 DIAGNOSIS — Z01818 Encounter for other preprocedural examination: Secondary | ICD-10-CM | POA: Insufficient documentation

## 2013-10-29 HISTORY — DX: Personal history of other specified conditions: Z87.898

## 2013-10-29 LAB — BASIC METABOLIC PANEL
BUN: 12 mg/dL (ref 6–23)
CO2: 25 mEq/L (ref 19–32)
Calcium: 9.3 mg/dL (ref 8.4–10.5)
Glucose, Bld: 88 mg/dL (ref 70–99)
Sodium: 137 mEq/L (ref 135–145)

## 2013-10-29 LAB — CBC
Hemoglobin: 13.7 g/dL (ref 12.0–15.0)
MCH: 33.3 pg (ref 26.0–34.0)
MCHC: 34.9 g/dL (ref 30.0–36.0)
MCV: 95.1 fL (ref 78.0–100.0)
Platelets: 255 10*3/uL (ref 150–400)
RBC: 4.12 MIL/uL (ref 3.87–5.11)
RDW: 12.7 % (ref 11.5–15.5)

## 2013-10-29 LAB — HCG, SERUM, QUALITATIVE: Preg, Serum: NEGATIVE

## 2013-10-31 ENCOUNTER — Encounter: Payer: Self-pay | Admitting: Internal Medicine

## 2013-10-31 ENCOUNTER — Telehealth: Payer: Self-pay | Admitting: Internal Medicine

## 2013-10-31 NOTE — Telephone Encounter (Signed)
Patient called thinking that Dr Graciela Husbands needed to see her EKG for surgery clearance. I explained to patient that he did not need to see the EKG for that reason, and explained that if cardiac clearance was needed the doctor's office would have contacted [place]. I explained that Dr. Graciela Husbands will be reviewing her signal average EKG, along with other tests, and then determine plan of care. I told her that Dr. Graciela Husbands or I would contact her with care plan by end of week. Patient agreeable to plan.

## 2013-10-31 NOTE — Telephone Encounter (Signed)
New Problem:  Pt states she has surgery tomorrow and is still waiting on surgical clearance from Dr. Graciela Husbands. Pt would like a call back from the nurse asap. Please advise pt on the status of her surgical clearance.

## 2013-11-01 ENCOUNTER — Ambulatory Visit (HOSPITAL_COMMUNITY): Payer: BC Managed Care – PPO | Admitting: Anesthesiology

## 2013-11-01 ENCOUNTER — Encounter (HOSPITAL_COMMUNITY): Payer: Self-pay | Admitting: *Deleted

## 2013-11-01 ENCOUNTER — Encounter (HOSPITAL_COMMUNITY)
Admission: RE | Disposition: A | Discharge status: Home / Self Care | Payer: Self-pay | Source: Ambulatory Visit | Attending: Surgery

## 2013-11-01 ENCOUNTER — Encounter (HOSPITAL_COMMUNITY): Payer: BC Managed Care – PPO | Admitting: Anesthesiology

## 2013-11-01 ENCOUNTER — Ambulatory Visit (HOSPITAL_COMMUNITY)
Admission: RE | Admit: 2013-11-01 | Discharge: 2013-11-01 | Disposition: A | Discharge status: Home / Self Care | Payer: BC Managed Care – PPO | Source: Ambulatory Visit | Attending: Surgery | Admitting: Surgery

## 2013-11-01 DIAGNOSIS — F17200 Nicotine dependence, unspecified, uncomplicated: Secondary | ICD-10-CM | POA: Insufficient documentation

## 2013-11-01 DIAGNOSIS — I4949 Other premature depolarization: Secondary | ICD-10-CM | POA: Insufficient documentation

## 2013-11-01 DIAGNOSIS — K420 Umbilical hernia with obstruction, without gangrene: Secondary | ICD-10-CM

## 2013-11-01 DIAGNOSIS — K430 Incisional hernia with obstruction, without gangrene: Secondary | ICD-10-CM | POA: Insufficient documentation

## 2013-11-01 DIAGNOSIS — K660 Peritoneal adhesions (postprocedural) (postinfection): Secondary | ICD-10-CM | POA: Insufficient documentation

## 2013-11-01 DIAGNOSIS — K432 Incisional hernia without obstruction or gangrene: Secondary | ICD-10-CM | POA: Diagnosis present

## 2013-11-01 HISTORY — PX: UMBILICAL HERNIA REPAIR: SHX196

## 2013-11-01 SURGERY — REPAIR, HERNIA, UMBILICAL, ADULT
Anesthesia: General | Site: Abdomen | Wound class: Clean

## 2013-11-01 MED ORDER — PROPOFOL 10 MG/ML IV BOLUS
INTRAVENOUS | Status: AC
  Filled 2013-11-01: qty 20

## 2013-11-01 MED ORDER — BUPIVACAINE-EPINEPHRINE 0.25% -1:200000 IJ SOLN
INTRAMUSCULAR | Status: AC
  Filled 2013-11-01: qty 1

## 2013-11-01 MED ORDER — LACTATED RINGERS IV SOLN: INTRAVENOUS | Status: DC

## 2013-11-01 MED ORDER — DEXAMETHASONE SODIUM PHOSPHATE 4 MG/ML IJ SOLN
INTRAMUSCULAR | Status: DC | PRN
  Administered 2013-11-01: 5 mg via INTRAVENOUS

## 2013-11-01 MED ORDER — LIDOCAINE HCL (CARDIAC) 20 MG/ML IV SOLN
INTRAVENOUS | Status: AC
  Filled 2013-11-01: qty 5

## 2013-11-01 MED ORDER — ONDANSETRON HCL 4 MG/2ML IJ SOLN
INTRAMUSCULAR | Status: DC | PRN
  Administered 2013-11-01: 4 mg via INTRAVENOUS

## 2013-11-01 MED ORDER — BUPIVACAINE-EPINEPHRINE 0.25% -1:200000 IJ SOLN
INTRAMUSCULAR | Status: DC | PRN
  Administered 2013-11-01: 50 mL

## 2013-11-01 MED ORDER — 0.9 % SODIUM CHLORIDE (POUR BTL) OPTIME
TOPICAL | Status: DC | PRN
  Administered 2013-11-01: 1000 mL

## 2013-11-01 MED ORDER — IBUPROFEN 400 MG PO TABS: 400.0000 mg | ORAL_TABLET | Freq: Four times a day (QID) | ORAL | Status: AC | PRN

## 2013-11-01 MED ORDER — SODIUM CHLORIDE 0.9 % IJ SOLN: 3.0000 mL | Freq: Two times a day (BID) | INTRAMUSCULAR | Status: DC

## 2013-11-01 MED ORDER — MIDAZOLAM HCL 2 MG/2ML IJ SOLN
INTRAMUSCULAR | Status: AC
  Filled 2013-11-01: qty 2

## 2013-11-01 MED ORDER — ACETAMINOPHEN 325 MG PO TABS: 650.0000 mg | ORAL_TABLET | ORAL | Status: DC | PRN

## 2013-11-01 MED ORDER — PROMETHAZINE HCL 25 MG/ML IJ SOLN: 6.2500 mg | INTRAMUSCULAR | Status: DC | PRN

## 2013-11-01 MED ORDER — SODIUM CHLORIDE 0.9 % IV SOLN: 250.0000 mL | INTRAVENOUS | Status: DC | PRN

## 2013-11-01 MED ORDER — PROPOFOL 10 MG/ML IV BOLUS
INTRAVENOUS | Status: DC | PRN
  Administered 2013-11-01: 170 mg via INTRAVENOUS

## 2013-11-01 MED ORDER — MIDAZOLAM HCL 5 MG/5ML IJ SOLN
INTRAMUSCULAR | Status: DC | PRN
  Administered 2013-11-01: 2 mg via INTRAVENOUS

## 2013-11-01 MED ORDER — FENTANYL CITRATE 0.05 MG/ML IJ SOLN: 25.0000 ug | INTRAMUSCULAR | Status: DC | PRN

## 2013-11-01 MED ORDER — ONDANSETRON HCL 4 MG/2ML IJ SOLN: 4.0000 mg | Freq: Four times a day (QID) | INTRAMUSCULAR | Status: DC | PRN

## 2013-11-01 MED ORDER — SODIUM CHLORIDE 0.9 % IJ SOLN: 3.0000 mL | INTRAMUSCULAR | Status: DC | PRN

## 2013-11-01 MED ORDER — ACETAMINOPHEN 650 MG RE SUPP
650.0000 mg | RECTAL | Status: DC | PRN
  Filled 2013-11-01: qty 1

## 2013-11-01 MED ORDER — FENTANYL CITRATE 0.05 MG/ML IJ SOLN
INTRAMUSCULAR | Status: AC
  Filled 2013-11-01: qty 5

## 2013-11-01 MED ORDER — CEFAZOLIN SODIUM-DEXTROSE 2-3 GM-% IV SOLR
2.0000 g | INTRAVENOUS | Status: AC
  Administered 2013-11-01: 2 g via INTRAVENOUS

## 2013-11-01 MED ORDER — LACTATED RINGERS IV SOLN
INTRAVENOUS | Status: DC
  Administered 2013-11-01: 1000 mL via INTRAVENOUS

## 2013-11-01 MED ORDER — METOCLOPRAMIDE HCL 5 MG/ML IJ SOLN
INTRAMUSCULAR | Status: DC | PRN
  Administered 2013-11-01: 10 mg via INTRAVENOUS

## 2013-11-01 MED ORDER — CEFAZOLIN SODIUM-DEXTROSE 2-3 GM-% IV SOLR
INTRAVENOUS | Status: AC
  Filled 2013-11-01: qty 50

## 2013-11-01 MED ORDER — HYDROMORPHONE HCL PF 1 MG/ML IJ SOLN: 0.2500 mg | INTRAMUSCULAR | Status: DC | PRN

## 2013-11-01 MED ORDER — KETOROLAC TROMETHAMINE 30 MG/ML IJ SOLN
INTRAMUSCULAR | Status: DC | PRN
  Administered 2013-11-01: 30 mg via INTRAVENOUS

## 2013-11-01 MED ORDER — OXYCODONE HCL 5 MG PO TABS: 5.0000 mg | ORAL_TABLET | ORAL | Status: DC | PRN

## 2013-11-01 MED ORDER — FENTANYL CITRATE 0.05 MG/ML IJ SOLN
INTRAMUSCULAR | Status: DC | PRN
  Administered 2013-11-01 (×2): 50 ug via INTRAVENOUS
  Administered 2013-11-01: 100 ug via INTRAVENOUS

## 2013-11-01 SURGICAL SUPPLY — 37 items
BLADE HEX COATED 2.75 (ELECTRODE) ×2 IMPLANT
BLADE SURG 15 STRL LF DISP TIS (BLADE) ×1 IMPLANT
BLADE SURG 15 STRL SS (BLADE) ×1
CANISTER SUCTION 2500CC (MISCELLANEOUS) ×2 IMPLANT
DECANTER SPIKE VIAL GLASS SM (MISCELLANEOUS) ×2 IMPLANT
DRAPE LAPAROSCOPIC ABDOMINAL (DRAPES) ×2 IMPLANT
DRSG TEGADERM 2-3/8X2-3/4 SM (GAUZE/BANDAGES/DRESSINGS) IMPLANT
DRSG TEGADERM 4X4.75 (GAUZE/BANDAGES/DRESSINGS) ×2 IMPLANT
ELECT REM PT RETURN 9FT ADLT (ELECTROSURGICAL) ×2
ELECTRODE REM PT RTRN 9FT ADLT (ELECTROSURGICAL) ×1 IMPLANT
GAUZE SPONGE 2X2 8PLY STRL LF (GAUZE/BANDAGES/DRESSINGS) ×1 IMPLANT
GLOVE BIOGEL PI IND STRL 7.5 (GLOVE) ×1 IMPLANT
GLOVE BIOGEL PI IND STRL 8 (GLOVE) ×1 IMPLANT
GLOVE BIOGEL PI INDICATOR 7.5 (GLOVE) ×1
GLOVE BIOGEL PI INDICATOR 8 (GLOVE) ×1
GLOVE ECLIPSE 8.0 STRL XLNG CF (GLOVE) ×2 IMPLANT
GLOVE INDICATOR 8.0 STRL GRN (GLOVE) ×2 IMPLANT
GOWN PREVENTION PLUS XXLARGE (GOWN DISPOSABLE) ×2 IMPLANT
GOWN STRL REIN XL XLG (GOWN DISPOSABLE) ×4 IMPLANT
KIT BASIN OR (CUSTOM PROCEDURE TRAY) ×2 IMPLANT
NEEDLE HYPO 22GX1.5 SAFETY (NEEDLE) ×2 IMPLANT
PACK BASIC VI WITH GOWN DISP (CUSTOM PROCEDURE TRAY) ×2 IMPLANT
PENCIL BUTTON HOLSTER BLD 10FT (ELECTRODE) ×2 IMPLANT
SPONGE GAUZE 2X2 STER 10/PKG (GAUZE/BANDAGES/DRESSINGS) ×1
SPONGE GAUZE 4X4 12PLY (GAUZE/BANDAGES/DRESSINGS) IMPLANT
SPONGE LAP 18X18 X RAY DECT (DISPOSABLE) ×2 IMPLANT
SUT ETHIBOND NAB CT1 #1 30IN (SUTURE) IMPLANT
SUT MNCRL AB 4-0 PS2 18 (SUTURE) ×2 IMPLANT
SUT NOVA NAB GS-21 0 18 T12 DT (SUTURE) IMPLANT
SUT PDS AB 1 CT1 27 (SUTURE) ×2 IMPLANT
SUT PROLENE 0 CT 1 30 (SUTURE) IMPLANT
SUT PROLENE 0 CT 1 CR/8 (SUTURE) IMPLANT
SYR 20CC LL (SYRINGE) ×2 IMPLANT
SYR BULB IRRIGATION 50ML (SYRINGE) ×2 IMPLANT
TOWEL OR 17X26 10 PK STRL BLUE (TOWEL DISPOSABLE) ×2 IMPLANT
TOWEL OR NON WOVEN STRL DISP B (DISPOSABLE) ×2 IMPLANT
YANKAUER SUCT BULB TIP 10FT TU (MISCELLANEOUS) ×2 IMPLANT

## 2013-11-01 NOTE — H&P (Signed)
Shelly Blackwell  22-Dec-1970 [id number]  CARE TEAM:  PCP: No PCP Per Patient  Outpatient Care Team: Patient Care Team: No Pcp Per Patient as PCP - General (General Practice) Duke Salvia, MD as Consulting Physician (Cardiology)  Inpatient Treatment Team: Treatment Team: Attending Provider: Ardeth Sportsman, MD  Reason for visit: Umbilical hernia  Woman with history of PVCs followed by cardiology. Smokes 1/2 PPD. Has noticed a lump at her bellybutton for the past season. Worse with cough/Valsalva. Not severely painful, but can be tender at times. Worse with sit-ups and moderate exercise. It concerned her. She wished to be evaluated. Had a laparoscopic cholecystectomy in the 1990s. Exercises regularly.   No exertional chest/neck/shoulder/arm pain. Patient can walk 120 minutes for about 5 miles without difficulty. BM daily. No history of MRSA or other skin infections.  No new events.   Past Medical History  Diagnosis Date  . Kidney stones     h/o-no surgery  . History of palpitations     Past Surgical History  Procedure Laterality Date  . Cesarean section  1994, 2001    x2  . Laparoscopic cholecystectomy  1995  . Breast enhancement surgery  2009  . Bunionectomy  1989, 1998    left foot    History   Social History  . Marital Status: Divorced    Spouse Name: N/A    Number of Children: N/A  . Years of Education: N/A   Occupational History  . Not on file.   Social History Main Topics  . Smoking status: Light Tobacco Smoker -- 0.50 packs/day    Types: Cigarettes  . Smokeless tobacco: Never Used  . Alcohol Use: No  . Drug Use: No  . Sexual Activity: Not on file     Comment: vasectomy   Other Topics Concern  . Not on file   Social History Narrative  . No narrative on file    Family History  Problem Relation Age of Onset  . Hepatitis Mother   . Liver cancer Mother   . Cirrhosis Mother     liver transplant  . Kidney Stones Mother   . Cancer Paternal  Grandmother     stomach  . Alcoholism Paternal Grandfather   . Alcoholism Maternal Grandfather     Current Facility-Administered Medications  Medication Dose Route Frequency Provider Last Rate Last Dose  . ceFAZolin (ANCEF) IVPB 2 g/50 mL premix  2 g Intravenous 60 min Pre-Op Ardeth Sportsman, MD      . lactated ringers infusion   Intravenous Continuous Einar Pheasant, MD 100 mL/hr at 11/01/13 1048 1,000 mL at 11/01/13 1048     Allergies  Allergen Reactions  . Nickel     unknown    ROS: Constitutional:  No fevers, chills, sweats.  Weight stable Eyes:  No vision changes, No discharge HENT:  No sore throats, nasal drainage Lymph: No neck swelling, No bruising easily Pulmonary:  No cough, productive sputum CV: No orthopnea, PND  Patient walks 120 minutes for about 3 miles without difficulty.  No exertional chest/neck/shoulder/arm pain. GI: No personal nor family history of GI/colon cancer, inflammatory bowel disease, irritable bowel syndrome, allergy such as Celiac Sprue, dietary/dairy problems, colitis, ulcers nor gastritis.  No recent sick contacts/gastroenteritis.  No travel outside the country.  No changes in diet. Renal: No UTIs, No hematuria Genital:  No drainage, bleeding, masses Musculoskeletal: No severe joint pain.  Good ROM major joints Skin:  No sores or lesions.  No rashes  Heme/Lymph:  No easy bleeding.  No swollen lymph nodes Neuro: No focal weakness/numbness.  No seizures Psych: No suicidal ideation.  No hallucinations  BP 128/84  Pulse 79  Temp(Src) 98.5 F (36.9 C) (Oral)  Resp 16  SpO2 99%  Physical Exam: General: Pt awake/alert/oriented x4 in no major acute distress Eyes: PERRL, normal EOM. Sclera nonicteric Neuro: CN II-XII intact w/o focal sensory/motor deficits. Lymph: No head/neck/groin lymphadenopathy Psych:  No delerium/psychosis/paranoia HENT: Normocephalic, Mucus membranes moist.  No thrush Neck: Supple, No tracheal deviation Chest: No  pain.  Good respiratory excursion. CV:  Pulses intact.  Regular rhythm Abdomen: Soft, Nondistended.  Small sensitive umb hernia. Ext:  SCDs BLE.  No significant edema.  No cyanosis Skin: No petechiae / purpurea.  No major sores Musculoskeletal: No severe joint pain.  Good ROM major joints   Results:   Labs: No results found for this or any previous visit (from the past 48 hour(s)).  Imaging / Studies: Dg Chest 2 View  10/29/2013   CLINICAL DATA:  Preop umbilical hernia repair, tobacco use.  EXAM: CHEST  2 VIEW  COMPARISON:  None.  FINDINGS: The heart size and mediastinal contours are within normal limits. Both lungs are clear. The visualized skeletal structures are unremarkable.  IMPRESSION: No active cardiopulmonary disease.   Electronically Signed   By: Roque Lias M.D.   On: 10/29/2013 09:13    Medications / Allergies: per chart  Antibiotics: Anti-infectives   Start     Dose/Rate Route Frequency Ordered Stop   11/01/13 1100  ceFAZolin (ANCEF) IVPB 2 g/50 mL premix     2 g 100 mL/hr over 30 Minutes Intravenous 60 min pre-op 11/01/13 1009        Assessment  Shelly Blackwell  42 y.o. female  Day of Surgery  Procedure(s): HERNIA REPAIR UMBILICAL ADULT  Problem List:  Active Problems:   * No active hospital problems. *   Incisional hernia at umbilicus  Plan:  Because it is getting more symptomatic, surgical repair is reasonable. I think that all she requires is a primary repair with stitches. Because it is rather sensitive at times, she wishes to deal with that now while and is small:   The anatomy & physiology of the abdominal wall was discussed.  The pathophysiology of hernias was discussed.  Natural history risks without surgery including progeressive enlargement, pain, incarceration & strangulation was discussed.   Contributors to complications such as smoking, obesity, diabetes, prior surgery, etc were discussed.   I feel the risks of no intervention will lead to  serious problems that outweigh the operative risks; therefore, I recommended surgery to reduce and repair the hernia.  I explained laparoscopic techniques with possible need for an open approach.  I noted the probable use of mesh to patch and/or buttress the hernia repair  Risks such as bleeding, infection, abscess, need for further treatment, heart attack, death, and other risks were discussed.  I noted a good likelihood this will help address the problem.   Goals of post-operative recovery were discussed as well.  Possibility that this will not correct all symptoms was explained.  I stressed the importance of low-impact activity, aggressive pain control, avoiding constipation, & not pushing through pain to minimize risk of post-operative chronic pain or injury. Possibility of reherniation especially with smoking, obesity, diabetes, immunosuppression, and other health conditions was discussed.  We will work to minimize complications.     An educational handout further explaining the pathology & treatment options  was given as well.  Questions were answered.  The patient expresses understanding & wishes to proceed with surgery.     Stop smoking. We talked to the patient about the dangers of smoking. We stressed that tobacco use dramatically increases the risk of peri-operative complications such as infection, tissue necrosis leaving to problems with incision/wound and organ healing, heart attack, stroke, DVT, pulmonary embolism, and death. We noted there are programs in our community to help stop smoking.   -VTE prophylaxis- SCDs, etc -mobilize as tolerated to help recovery    Ardeth Sportsman, M.D., F.A.C.S. Gastrointestinal and Minimally Invasive Surgery [facility] [address], [address] [place], [place] [address] [phone] Main / Paging   11/01/2013

## 2013-11-01 NOTE — Transfer of Care (Signed)
Immediate Anesthesia Transfer of Care Note  Patient: Shelly Blackwell  Procedure(s) Performed: Procedure(s): HERNIA REPAIR UMBILICAL ADULT (N/A)  Patient Location: PACU  Anesthesia Type:General  Level of Consciousness: awake, alert , oriented and patient cooperative  Airway & Oxygen Therapy: Patient Spontanous Breathing and Patient connected to face mask oxygen  Post-op Assessment: Report given to PACU RN, Post -op Vital signs reviewed and stable and Patient moving all extremities  Post vital signs: Reviewed and stable  Complications: No apparent anesthesia complications

## 2013-11-01 NOTE — Anesthesia Preprocedure Evaluation (Addendum)
Anesthesia Evaluation  Patient identified by MRN, date of birth, ID band Patient awake    Reviewed: Allergy & Precautions, H&P , NPO status , Patient's Chart, lab work & pertinent test results  Airway Mallampati: II TM Distance: >3 FB Neck ROM: Full    Dental  (+) Teeth Intact and Dental Advisory Given   Pulmonary neg pulmonary ROS, Current Smoker,  breath sounds clear to auscultation  Pulmonary exam normal       Cardiovascular negative cardio ROS  Rhythm:Regular Rate:Normal     Neuro/Psych negative neurological ROS  negative psych ROS   GI/Hepatic negative GI ROS, Neg liver ROS,   Endo/Other  negative endocrine ROS  Renal/GU negative Renal ROS  negative genitourinary   Musculoskeletal negative musculoskeletal ROS (+)   Abdominal   Peds  Hematology negative hematology ROS (+)   Anesthesia Other Findings Bonding upper incisors  Reproductive/Obstetrics negative OB ROS                         Anesthesia Physical Anesthesia Plan  ASA: I  Anesthesia Plan: General   Post-op Pain Management:    Induction: Intravenous  Airway Management Planned: LMA  Additional Equipment:   Intra-op Plan:   Post-operative Plan: Extubation in OR  Informed Consent: I have reviewed the patients History and Physical, chart, labs and discussed the procedure including the risks, benefits and alternatives for the proposed anesthesia with the patient or authorized representative who has indicated his/her understanding and acceptance.   Dental advisory given  Plan Discussed with: CRNA  Anesthesia Plan Comments:        Anesthesia Quick Evaluation

## 2013-11-01 NOTE — OR Nursing (Signed)
Top of left thigh there is a small open sore patient had prior to the OR.

## 2013-11-01 NOTE — Anesthesia Postprocedure Evaluation (Signed)
Anesthesia Post Note  Patient: Shelly Blackwell  Procedure(s) Performed: Procedure(s) (LRB): HERNIA REPAIR UMBILICAL ADULT (N/A)  Anesthesia type: General  Patient location: PACU  Post pain: Pain level controlled  Post assessment: Post-op Vital signs reviewed  Last Vitals:  Filed Vitals:   11/01/13 1229  BP:   Pulse:   Temp: 36.7 C  Resp:     Post vital signs: Reviewed  Level of consciousness: sedated  Complications: No apparent anesthesia complications

## 2013-11-01 NOTE — Op Note (Signed)
11/01/2013  12:18 PM  PATIENT:  Shelly Blackwell  42 y.o. female  Patient Care Team: No Pcp Per Patient as PCP - General (General Practice) Duke Salvia, MD as Consulting Physician (Cardiology)  PRE-OPERATIVE DIAGNOSIS:  UMBILICAL INCISIONAL HERNIA   POST-OPERATIVE DIAGNOSIS:  UMBILICAL INCISIONAL HERNIA - incarcerated  PROCEDURE:  Procedure(s): HERNIA REPAIR UMBILICAL ADULT - Primary repair  SURGEON:  Surgeon(s): Ardeth Sportsman, MD  ANESTHESIA:   local and general  EBL:     Delay start of Pharmacological VTE agent (>24hrs) due to surgical blood loss or risk of bleeding:  no  DRAINS: none   SPECIMEN:  No Specimen  DISPOSITION OF SPECIMEN:  N/A  COUNTS:  YES  PLAN OF CARE: Discharge to home after PACU  PATIENT DISPOSITION:  PACU - hemodynamically stable.  INDICATION: Pt with umbilical incisional hernia. I recommended repair:   The anatomy & physiology of the abdominal wall was discussed. The pathophysiology of hernias was discussed. Natural history risks without surgery including progeressive enlargement, pain, incarceration & strangulation was discussed. Contributors to complications such as smoking, obesity, diabetes, prior surgery, etc were discussed.  I feel the risks of no intervention will lead to serious problems that outweigh the operative risks; therefore, I recommended surgery to reduce and repair the hernia. I explained laparoscopic techniques with possible need for an open approach. I noted the probable use of mesh to patch and/or buttress the hernia repair   Risks such as bleeding, infection, abscess, need for further treatment, heart attack, death, and other risks were discussed. I noted a good likelihood this will help address the problem. Goals of post-operative recovery were discussed as well. Possibility that this will not correct all symptoms was explained. I stressed the importance of low-impact activity, aggressive pain control, avoiding constipation,  & not pushing through pain to minimize risk of post-operative chronic pain or injury. Possibility of reherniation especially with smoking, obesity, diabetes, immunosuppression, and other health conditions was discussed. We will work to minimize complications.   An educational handout further explaining the pathology & treatment options was given as well. Questions were answered. The patient expresses understanding & wishes to proceed with surgery.   OR FINDINGS: 1cm hernia through umbilical stalk w omental adhesion. Repaired primarily with #1 PDS   DESCRIPTION:   Informed consent was confirmed. The patient received IV antibiotics and underwent general anesthesia without any difficulty. The patient was positioned supine. SCDs were active during the entire case. The abdomen was cprepped and draped in a sterile fashion. A surgical timeout confirmed our plan.   And made a curvilinear incision around the inferior umbilical fold. I freed around the umbilical stalk. I opened up the hernia sac and found omentum. I transected that adherent to the hernia sac itself. The rest was reduced back into the peritoneum. I freed the umbilical stalk from the fascia.   Because the fascia defect was only 1 cm and she was not obese; I decided to close it primarily. I did this with #1 PDS interrupted stitches transversely. Tissues came together well. I tacked the umbilical stalk to the fascia using Monocryl stitch. I closed the skin using running 4-0 Monocryl stitch. Sterile dressing applied. Patient extubated and sent to recovery room in good condition.   I discussed postoperative care with the patient in the office and in the holding area.I updated the patient's status to the patient's mother on the phone [phone].  Recommendations were made.  Questions were answered.  She expressed understanding &  appreciation.

## 2013-11-02 ENCOUNTER — Encounter (HOSPITAL_COMMUNITY): Payer: Self-pay | Admitting: Surgery

## 2013-11-02 NOTE — Telephone Encounter (Signed)
She is acceptable risk for surgery

## 2013-11-14 ENCOUNTER — Encounter (INDEPENDENT_AMBULATORY_CARE_PROVIDER_SITE_OTHER): Payer: BC Managed Care – PPO | Admitting: Surgery

## 2013-11-15 ENCOUNTER — Telehealth: Payer: Self-pay | Admitting: Internal Medicine

## 2013-11-15 NOTE — Telephone Encounter (Signed)
Follow Up  Pt calling to follow Up on the 3 test results from the hospital// Please call back to discuss//

## 2013-11-16 NOTE — Addendum Note (Signed)
Encounter addended by: Sanda Linger, CCT on: 11/16/2013  1:46 PM<BR>     Documentation filed: Charges VN

## 2013-11-16 NOTE — Telephone Encounter (Signed)
Dr. Graciela Husbands talked with patient last night. SAEKG located at hospital today - Dr. Graciela Husbands to review it. Patient agreeable to plan.

## 2013-11-16 NOTE — Addendum Note (Signed)
Encounter addended by: Sanda Linger, CCT on: 11/16/2013  1:44 PM<BR>     Documentation filed: Charges VN

## 2013-11-21 NOTE — Telephone Encounter (Signed)
Pt notified that SAEKG was normal.  Follow up as needed with Dr. Graciela Husbands.  Pt agreeable to plan.

## 2013-12-04 ENCOUNTER — Other Ambulatory Visit (HOSPITAL_COMMUNITY): Payer: BC Managed Care – PPO

## 2014-01-28 ENCOUNTER — Telehealth (INDEPENDENT_AMBULATORY_CARE_PROVIDER_SITE_OTHER): Payer: Self-pay

## 2014-01-28 NOTE — Telephone Encounter (Signed)
Called patient informed her to apply ice/heat to the area and to decrease activity for the next 3 weeks, if pain becomes worse to call or area is not better in 3 weeks to call for re eval. Patient verbalized understanding

## 2014-01-28 NOTE — Telephone Encounter (Signed)
Patient states she is having externe soreness in the umbilical area, Denies lifting any thing, strenuous activity, constipation, diarrhea. Please advise

## 2014-01-28 NOTE — Telephone Encounter (Signed)
I recommend ice/heat/anti-inflammatories/reduce activity for 3 weeks.  If not better, consider surgical reevaluation:  Managing Pain  Pain after surgery or related to activity is often due to strain/injury to muscle, tendon, nerves and/or incisions.  This pain is usually short-term and will improve in a few months.   Many people find it helpful to do the following things TOGETHER to help speed the process of healing and to get back to regular activity more quickly:  1. Avoid heavy physical activity a.  no lifting greater than 20 pounds b. Do not "push through" the pain.  Listen to your body and avoid positions and maneuvers than reproduce the pain c. Walking is okay as tolerated, but go slowly and stop when getting sore.  d. Remember: If it hurts to do it, then don't do it! 2. Take Anti-inflammatory medication  a. Take with food/snack around the clock for 1-2 weeks i. This helps the muscle and nerve tissues become less irritable and calm down faster b. Choose ONE of the following over-the-counter medications: i. Naproxen 220mg  tabs (ex. Aleve) 1-2 pills twice a day  ii. Ibuprofen 200mg  tabs (ex. Advil, Motrin) 3-4 pills with every meal and just before bedtime iii. Acetaminophen 500mg  tabs (Tylenol) 1-2 pills with every meal and just before bedtime 3. Use a Heating pad or Ice/Cold Pack a. 4-6 times a day b. May use warm bath/hottub  or showers 4. Try Gentle Massage and/or Stretching  a. at the area of pain many times a day b. stop if you feel pain - do not overdo it  Try these steps together to help you body heal faster and avoid making things get worse.  Doing just one of these things may not be enough.    If you are not getting better after two weeks or are noticing you are getting worse, contact our office for further advice; we may need to re-evaluate you & see what other things we can do to help.

## 2014-04-24 ENCOUNTER — Other Ambulatory Visit: Payer: Self-pay

## 2014-04-24 DIAGNOSIS — Z1231 Encounter for screening mammogram for malignant neoplasm of breast: Secondary | ICD-10-CM

## 2014-07-01 ENCOUNTER — Ambulatory Visit
Admission: RE | Admit: 2014-07-01 | Discharge: 2014-07-01 | Disposition: A | Discharge status: Home / Self Care | Payer: BC Managed Care – PPO | Source: Ambulatory Visit

## 2014-07-01 DIAGNOSIS — Z1231 Encounter for screening mammogram for malignant neoplasm of breast: Secondary | ICD-10-CM

## 2014-09-04 ENCOUNTER — Ambulatory Visit (INDEPENDENT_AMBULATORY_CARE_PROVIDER_SITE_OTHER): Payer: BC Managed Care – PPO | Admitting: Physician Assistant

## 2014-09-04 VITALS — BP 112/70 | HR 64 | Temp 98.6°F | Resp 16 | Ht 66.75 in | Wt 160.2 lb

## 2014-09-04 DIAGNOSIS — F458 Other somatoform disorders: Secondary | ICD-10-CM

## 2014-09-04 DIAGNOSIS — Z23 Encounter for immunization: Secondary | ICD-10-CM

## 2014-09-04 DIAGNOSIS — R0989 Other specified symptoms and signs involving the circulatory and respiratory systems: Secondary | ICD-10-CM

## 2014-09-04 MED ORDER — ESOMEPRAZOLE MAGNESIUM 40 MG PO CPDR: 40.0000 mg | DELAYED_RELEASE_CAPSULE | Freq: Every day | ORAL | Status: DC

## 2014-09-04 NOTE — Progress Notes (Signed)
Subjective:    Patient ID: Shelly Blackwell, female    DOB: [birth year], 43 y.o.   MRN: [id number]   PCP: No PCP Per Patient  Chief Complaint  Patient presents with  . Sore Throat    x1 month, Pt. states it feels like someone is holding their finger against her throat.       Active Ambulatory Problems    Diagnosis Date Noted  . PVC's (premature ventricular contractions) 09/14/2013  . Tobacco abuse 10/01/2013  . Incisional hernia - periumbilical 03/50/0938   Resolved Ambulatory Problems    Diagnosis Date Noted  . No Resolved Ambulatory Problems   Past Medical History  Diagnosis Date  . Kidney stones   . History of palpitations     Past Surgical History  Procedure Laterality Date  . Cesarean section  1994, 2001    x2  . Laparoscopic cholecystectomy  1995  . Breast enhancement surgery  2009  . [place]    left foot  . Umbilical hernia repair N/A 11/01/2013    Procedure: HERNIA REPAIR UMBILICAL ADULT;  Surgeon: Adin Hector, MD;  Location: [facility];  Service: General;  Laterality: N/A;  . Breast surgery    . Hernia repair      Allergies  Allergen Reactions  . Nickel Rash    Skin reaction with contact to nickel    Prior to Admission medications   Medication Sig Start Date End Date Taking? Authorizing Provider  fish oil-omega-3 fatty acids 1000 MG capsule Take 2 g by mouth daily.   Yes Historical Provider, MD  ibuprofen (ADVIL,MOTRIN) 400 MG tablet Take 1-2 tablets (400-800 mg total) by mouth every 6 (six) hours as needed for fever, headache, mild pain, moderate pain or cramping. 11/01/13  Yes Michael Boston, MD  Multiple Vitamins-Minerals (MULTIVITAMIN PO) Take by mouth daily.   Yes Historical Provider, MD    History   Social History  . Marital Status: Divorced    Spouse Name: n/a    Number of Children: 2  . Years of Education: 15   Occupational History  . Wellsite geologist    Social History Main Topics  . Smoking status: Heavy Tobacco  Smoker -- 1.00 packs/day for 20 years    Types: Cigarettes  . Smokeless tobacco: Never Used     Comment: recent increase from 1/2 ppd; "I've quit so many times."  . Alcohol Use: No  . Drug Use: No  . Sexual Activity: None     Comment: vasectomy   Other Topics Concern  . None   Social History Narrative   Lives with her youngest daughter. Her older daughter lives in [place], [place].    family history includes Alcoholism in her maternal grandfather and paternal grandfather; Cancer in her paternal grandmother; Cirrhosis in her mother; Hepatitis in her mother; Kidney Stones in her mother; Liver cancer in her mother. indicated that her mother is alive. She indicated that her father is alive. She indicated that her sister is alive. She indicated that both of her brothers are alive. She indicated that both of her daughters are alive.   HPI  This 43 y.o. female presents for evaluation of a discomfort in the throat present x 1 month.  Like there is a pill stuck there.  This is different from GERD she's experienced previously (and for which she underwent upper endoscopy). A little bit of hoarseness, briefly intermittently.  No appetite, energy or weight changes.  She is a smoker, and is worried  about her symptoms as related to that.   Review of Systems As above.    Objective:   Physical Exam  Constitutional: She is oriented to person, place, and time. She appears well-developed and well-nourished. She is active and cooperative. No distress.  BP 112/70  Pulse 64  Temp(Src) 98.6 F (37 C) (Oral)  Resp 16  Ht 5' 6.75" (1.695 m)  Wt 160 lb 3.2 oz (72.666 kg)  BMI 25.29 kg/m2  SpO2 99%  LMP 09/04/2014   HENT:  Head: Normocephalic and atraumatic.  Right Ear: Hearing, tympanic membrane, external ear and ear canal normal.  Left Ear: Hearing, tympanic membrane, external ear and ear canal normal.  Nose: Nose normal.  Mouth/Throat: Uvula is midline, oropharynx is clear and moist and mucous  membranes are normal. Normal dentition.  Eyes: Conjunctivae, EOM and lids are normal. Pupils are equal, round, and reactive to light. No scleral icterus.  Neck: Normal range of motion, full passive range of motion without pain and phonation normal. Neck supple. No mass and no thyromegaly present.  Cardiovascular: Normal rate, regular rhythm and normal heart sounds.   Pulmonary/Chest: Effort normal and breath sounds normal.  Abdominal: Bowel sounds are normal.  Neurological: She is alert and oriented to person, place, and time.  Skin: Skin is warm and dry.  Psychiatric: She has a normal mood and affect. Her speech is normal and behavior is normal.          Assessment & Plan:  1. Globus sensation Suspect Laryngopharyngeal Reflux. Refer to ENT. Anticipate laryngoscopy. Start PPI. Counseled on ways to reduce symptoms with lifestyle changes. - Ambulatory referral to ENT - esomeprazole (NEXIUM) 40 MG capsule; Take 1 capsule (40 mg total) by mouth daily at 12 noon.  Dispense: 30 capsule; Refill: 5  2. Need for influenza vaccination - Flu Vaccine QUAD 36+ mos IM   Fara Chute, PA-C Physician Assistant-Certified [facility]

## 2014-09-04 NOTE — Patient Instructions (Signed)
If you have not heard anything regarding the referral in 1 week, please contact our office.  Things that often make reflux symptoms worse: Caffeine Carbonation (soda) Spicy foods Acidic foods (like tomato sauce, orange juice, lemonade) Fatty foods (including whole milk and ice cream) Stress (feeling sad, worried, nervous) Nicotine Alcohol Non-steroidal anti-inflammatory medications (like ibuprofen and naproxen)

## 2014-09-23 ENCOUNTER — Encounter: Payer: Self-pay | Admitting: Physician Assistant

## 2014-09-23 DIAGNOSIS — K219 Gastro-esophageal reflux disease without esophagitis: Secondary | ICD-10-CM

## 2015-05-05 ENCOUNTER — Other Ambulatory Visit: Payer: Self-pay

## 2015-05-05 DIAGNOSIS — Z1231 Encounter for screening mammogram for malignant neoplasm of breast: Secondary | ICD-10-CM

## 2015-06-11 ENCOUNTER — Encounter: Payer: Self-pay | Admitting: Obstetrics and Gynecology

## 2015-06-11 ENCOUNTER — Ambulatory Visit (INDEPENDENT_AMBULATORY_CARE_PROVIDER_SITE_OTHER): Payer: BLUE CROSS/BLUE SHIELD | Admitting: Obstetrics and Gynecology

## 2015-06-11 VITALS — BP 122/64 | HR 80 | Resp 16 | Ht 66.5 in | Wt 169.0 lb

## 2015-06-11 DIAGNOSIS — Z720 Tobacco use: Secondary | ICD-10-CM

## 2015-06-11 DIAGNOSIS — Z0000 Encounter for general adult medical examination without abnormal findings: Secondary | ICD-10-CM | POA: Diagnosis not present

## 2015-06-11 DIAGNOSIS — Z01419 Encounter for gynecological examination (general) (routine) without abnormal findings: Secondary | ICD-10-CM

## 2015-06-11 LAB — POCT URINALYSIS DIPSTICK
Bilirubin, UA: NEGATIVE
Glucose, UA: NEGATIVE
Ketones, UA: NEGATIVE
Leukocytes, UA: NEGATIVE
Nitrite, UA: NEGATIVE
PH UA: 6.5
Protein, UA: NEGATIVE
Spec Grav, UA: 1.015
UROBILINOGEN UA: NEGATIVE

## 2015-06-11 NOTE — Progress Notes (Signed)
Patient ID: Shelly Blackwell, female   DOB: 05/23/1971, 44 y.o.   MRN: [id number] 44 y.o. [id number] Divorced Caucasian female here for annual exam.  Menses q month x 4-6 days. Saturates a regular tampon in up to 1 hour for 1 day. No BTB, mild cramps.   PCP:   No PCP  Patient's last menstrual period was 06/10/2015.          Sexually active: No.  The current method of family planning is none.    Exercising: Yes.    walking/ yard work Smoker:  yes  Health Maintenance: Pap:  05-22-13 WNL NEG HR HPV History of abnormal Pap:  Yes 1993 no treatment- repeat PAP was normal  MMG:  07-01-14 Colonoscopy:  Never BMD:   Never TDaP:  05-22-13 Screening Labs: labs drawn today Hb today: 12.7, Urine today: RBC++ patient on her menstrual cycle   reports that she has been smoking Cigarettes.  She has a 20 pack-year smoking history. She has never used smokeless tobacco. She reports that she does not drink alcohol or use illicit drugs. She is currently smoking a little under a pack a day. She has quit several times in the past. She is under stress, has quit in the past on the patch. She didn't tolerate oral medication.   Past Medical History  Diagnosis Date  . Kidney stones     h/o-no surgery  . History of palpitations     Past Surgical History  Procedure Laterality Date  . Cesarean section  1994, 2001    x2  . Laparoscopic cholecystectomy  1995  . Breast enhancement surgery  2009  . [place]    left foot  . Umbilical hernia repair N/A 11/01/2013    Procedure: HERNIA REPAIR UMBILICAL ADULT;  Surgeon: Adin Hector, MD;  Location: [facility];  Service: General;  Laterality: N/A;  . Breast surgery    . Hernia repair      Current Outpatient Prescriptions  Medication Sig Dispense Refill  . fish oil-omega-3 fatty acids 1000 MG capsule Take 2 g by mouth daily.    Marland Kitchen ibuprofen (ADVIL,MOTRIN) 400 MG tablet Take 1-2 tablets (400-800 mg total) by mouth every 6 (six) hours as needed for fever,  headache, mild pain, moderate pain or cramping. 40 tablet 1  . Multiple Vitamins-Minerals (MULTIVITAMIN PO) Take by mouth daily.     No current facility-administered medications for this visit.    Family History  Problem Relation Age of Onset  . Hepatitis Mother   . Liver cancer Mother   . Cirrhosis Mother     liver transplant  . Kidney Stones Mother   . Cancer Paternal Grandmother     stomach  . Alcoholism Paternal Grandfather   . Alcoholism Maternal Grandfather     ROS:  Pertinent items are noted in HPI.  Otherwise, a comprehensive ROS was negative.  Exam:   BP 122/64 mmHg  Pulse 80  Resp 16  Ht 5' 6.5" (1.689 m)  Wt 169 lb (76.658 kg)  BMI 26.87 kg/m2  LMP 06/10/2015    General appearance: alert, cooperative and appears stated age Head: Normocephalic, without obvious abnormality, atraumatic Neck: no adenopathy, supple, symmetrical, trachea midline and thyroid normal to inspection and palpation Lungs: clear to auscultation bilaterally Breasts: normal appearance, no masses or tenderness , bilateral breast implants Heart: regular rate and rhythm Abdomen: soft, non-tender; bowel sounds normal; no masses,  no organomegaly Extremities: extremities normal, atraumatic, no cyanosis or edema Skin: Skin color,  texture, turgor normal. No rashes or lesions Lymph nodes: Cervical, supraclavicular, and axillary nodes normal. No abnormal inguinal nodes palpated Neurologic: Grossly normal  Pelvic: External genitalia:  no lesions              Urethra:  normal appearing urethra with no masses, tenderness or lesions              Bartholins and Skenes: normal                 Vagina: normal appearing vagina with normal color and discharge, no lesions              Cervix: no lesions              Pap taken: No. Bimanual Exam:  Uterus:  normal size, contour, position, consistency, mobility, non-tender              Adnexa: normal adnexa and no mass, fullness, tenderness               Rectovaginal: Yes.  .  Confirms.              Anus:  normal sphincter tone, no lesions  Chaperone was present for exam.  Assessment:   Well woman visit with normal exam. No contraception needed Desires screening labs Counseled on smoking cessation   Plan: Yearly mammogram recommended after age 89. She has an appointment in July, 2016 Recommended self breast exam.  No pap this year, reviewed guidlines. Labs performed.  Yes.  .   See orders. Refills given on medications.  No..  See orders. Follow up annually and prn.  Send to Smoking Cessation program  After visit summary provided.

## 2015-06-12 LAB — LIPID PANEL
CHOL/HDL RATIO: 2.1 ratio
Cholesterol: 97 mg/dL (ref 0–200)
HDL: 46 mg/dL (ref >=46)
LDL CALC: 37 mg/dL (ref 0–99)
TRIGLYCERIDES: 71 mg/dL (ref <150)
VLDL: 14 mg/dL (ref 0–40)

## 2015-06-12 LAB — COMPREHENSIVE METABOLIC PANEL
ALBUMIN: 4 g/dL (ref 3.5–5.2)
ALT: 15 U/L (ref 0–35)
AST: 14 U/L (ref 0–37)
Alkaline Phosphatase: 57 U/L (ref 39–117)
BUN: 11 mg/dL (ref 6–23)
CO2: 29 meq/L (ref 19–32)
Calcium: 8.7 mg/dL (ref 8.4–10.5)
Chloride: 104 mEq/L (ref 96–112)
Creat: 0.54 mg/dL (ref 0.50–1.10)
Glucose, Bld: 96 mg/dL (ref 70–99)
POTASSIUM: 4.1 meq/L (ref 3.5–5.3)
Sodium: 141 mEq/L (ref 135–145)
TOTAL PROTEIN: 6 g/dL (ref 6.0–8.3)
Total Bilirubin: 0.4 mg/dL (ref 0.2–1.2)

## 2015-06-12 LAB — CBC
HCT: 37.4 % (ref 36.0–46.0)
HEMOGLOBIN: 12.7 g/dL (ref 12.0–15.0)
MCH: 31.7 pg (ref 26.0–34.0)
MCHC: 34 g/dL (ref 30.0–36.0)
MCV: 93.3 fL (ref 78.0–100.0)
MPV: 10.1 fL (ref 8.6–12.4)
PLATELETS: 255 10*3/uL (ref 150–400)
RBC: 4.01 MIL/uL (ref 3.87–5.11)
RDW: 13.6 % (ref 11.5–15.5)
WBC: 9.4 10*3/uL (ref 4.0–10.5)

## 2015-06-12 LAB — VITAMIN D 25 HYDROXY (VIT D DEFICIENCY, FRACTURES): Vit D, 25-Hydroxy: 34 ng/mL (ref 30–100)

## 2015-06-17 LAB — HEMOGLOBIN, FINGERSTICK: Hemoglobin, fingerstick: 12.7 g/dL (ref 12.0–16.0)

## 2015-06-20 ENCOUNTER — Telehealth: Payer: Self-pay | Admitting: *Deleted

## 2015-06-20 NOTE — Telephone Encounter (Signed)
Call to [facility] Smoking Cessation Program. Patient will need to call [phone] to register for class. She will have multiple options but has to register first. Call to patient, left message to call back.

## 2015-07-01 NOTE — Telephone Encounter (Signed)
Attempted call to patient with this information. 06/23/15 first attempt with voicemail and 07/01/15 second attempt with voicemail. Pt not identified on voicemail. Requested call back.

## 2015-07-04 ENCOUNTER — Inpatient Hospital Stay: Admission: RE | Admit: 2015-07-04 | Payer: Self-pay | Source: Ambulatory Visit

## 2015-07-11 NOTE — Telephone Encounter (Signed)
Call to patient, left message to call back. Ask for triage.

## 2015-07-14 ENCOUNTER — Other Ambulatory Visit: Payer: Self-pay

## 2015-07-14 DIAGNOSIS — Z1231 Encounter for screening mammogram for malignant neoplasm of breast: Secondary | ICD-10-CM

## 2015-07-17 ENCOUNTER — Encounter: Payer: Self-pay | Admitting: *Deleted

## 2015-07-17 NOTE — Telephone Encounter (Signed)
Can we please send her a letter with the information she needs. Thanks

## 2015-07-17 NOTE — Telephone Encounter (Signed)
No response from patient. Any further follow-up?

## 2015-07-23 ENCOUNTER — Encounter: Payer: Self-pay | Admitting: Gastroenterology

## 2015-07-24 NOTE — Telephone Encounter (Signed)
Letter printed for your review. Please advise

## 2015-07-24 NOTE — Telephone Encounter (Signed)
Letter with smoking cessation class information reviewed and signed by Dr Talbert Nan. Encounter closed.

## 2015-08-19 ENCOUNTER — Ambulatory Visit
Admission: RE | Admit: 2015-08-19 | Discharge: 2015-08-19 | Disposition: A | Discharge status: Home / Self Care | Payer: BLUE CROSS/BLUE SHIELD | Source: Ambulatory Visit

## 2015-08-19 DIAGNOSIS — Z1231 Encounter for screening mammogram for malignant neoplasm of breast: Secondary | ICD-10-CM

## 2016-01-06 ENCOUNTER — Encounter: Payer: Self-pay | Admitting: Gastroenterology

## 2016-01-06 NOTE — Telephone Encounter (Signed)
A user error has taken place.

## 2016-06-10 ENCOUNTER — Other Ambulatory Visit: Payer: Self-pay | Admitting: Obstetrics & Gynecology

## 2016-06-10 DIAGNOSIS — Z1231 Encounter for screening mammogram for malignant neoplasm of breast: Secondary | ICD-10-CM

## 2016-06-16 ENCOUNTER — Ambulatory Visit: Payer: BLUE CROSS/BLUE SHIELD | Admitting: Obstetrics and Gynecology

## 2016-06-16 ENCOUNTER — Encounter: Payer: Self-pay | Admitting: Obstetrics and Gynecology

## 2016-06-16 ENCOUNTER — Telehealth: Payer: Self-pay | Admitting: Obstetrics and Gynecology

## 2016-06-16 NOTE — Telephone Encounter (Signed)
Patient dnka her 8:00am aex today, [facility] policy followed.

## 2016-08-19 ENCOUNTER — Ambulatory Visit
Admission: RE | Admit: 2016-08-19 | Discharge: 2016-08-19 | Disposition: A | Discharge status: Home / Self Care | Payer: BLUE CROSS/BLUE SHIELD | Source: Ambulatory Visit | Attending: Obstetrics & Gynecology | Admitting: Obstetrics & Gynecology

## 2016-08-19 DIAGNOSIS — Z1231 Encounter for screening mammogram for malignant neoplasm of breast: Secondary | ICD-10-CM

## 2016-12-23 ENCOUNTER — Encounter: Payer: Self-pay | Admitting: Obstetrics & Gynecology

## 2016-12-23 ENCOUNTER — Ambulatory Visit (INDEPENDENT_AMBULATORY_CARE_PROVIDER_SITE_OTHER): Payer: BLUE CROSS/BLUE SHIELD | Admitting: Obstetrics & Gynecology

## 2016-12-23 VITALS — BP 100/60 | HR 62 | Resp 12 | Ht 66.75 in | Wt 162.0 lb

## 2016-12-23 DIAGNOSIS — Z01419 Encounter for gynecological examination (general) (routine) without abnormal findings: Secondary | ICD-10-CM

## 2016-12-23 DIAGNOSIS — N926 Irregular menstruation, unspecified: Secondary | ICD-10-CM

## 2016-12-23 DIAGNOSIS — Z124 Encounter for screening for malignant neoplasm of cervix: Secondary | ICD-10-CM | POA: Diagnosis not present

## 2016-12-23 DIAGNOSIS — Z0000 Encounter for general adult medical examination without abnormal findings: Secondary | ICD-10-CM

## 2016-12-23 LAB — POCT URINALYSIS DIPSTICK
Bilirubin, UA: NEGATIVE
Glucose, UA: NEGATIVE
Ketones, UA: NEGATIVE
LEUKOCYTES UA: NEGATIVE
NITRITE UA: NEGATIVE
PROTEIN UA: NEGATIVE
UROBILINOGEN UA: NEGATIVE
pH, UA: 6

## 2016-12-23 LAB — COMPREHENSIVE METABOLIC PANEL
ALT: 19 U/L (ref 6–29)
AST: 19 U/L (ref 10–35)
Albumin: 4.3 g/dL (ref 3.6–5.1)
Alkaline Phosphatase: 71 U/L (ref 33–115)
BUN: 18 mg/dL (ref 7–25)
CHLORIDE: 104 mmol/L (ref 98–110)
CO2: 26 mmol/L (ref 20–31)
CREATININE: 0.82 mg/dL (ref 0.50–1.10)
Calcium: 9.3 mg/dL (ref 8.6–10.2)
GLUCOSE: 89 mg/dL (ref 65–99)
Potassium: 4.5 mmol/L (ref 3.5–5.3)
SODIUM: 139 mmol/L (ref 135–146)
TOTAL PROTEIN: 6.9 g/dL (ref 6.1–8.1)
Total Bilirubin: 0.3 mg/dL (ref 0.2–1.2)

## 2016-12-23 LAB — CBC
HCT: 42 % (ref 35.0–45.0)
Hemoglobin: 13.9 g/dL (ref 11.7–15.5)
MCH: 32 pg (ref 27.0–33.0)
MCHC: 33.1 g/dL (ref 32.0–36.0)
MCV: 96.8 fL (ref 80.0–100.0)
MPV: 10 fL (ref 7.5–12.5)
PLATELETS: 274 10*3/uL (ref 140–400)
RBC: 4.34 MIL/uL (ref 3.80–5.10)
RDW: 13.5 % (ref 11.0–15.0)
WBC: 10.7 10*3/uL (ref 3.8–10.8)

## 2016-12-23 MED ORDER — SUCRALFATE 1 GM/10ML PO SUSP: 1.0000 g | Freq: Three times a day (TID) | ORAL | 0 refills | Status: DC

## 2016-12-23 NOTE — Progress Notes (Signed)
46 y.o. [id number] DivorcedCaucasianF here for annual exam.  Doing well.  I haven't seen pt in about 4 years.  Reports issues with chest wall pain that is intermittent.  This is bothersome  S[facility]. Harley Hallmark at [facility] and upper endoscopy and biopsies 12/16.  Started on Carafate and didn't take it due to cost.  Would love to be able to try it.  (looks like tier 1 for her rx plan now).  Cycles are changing so sometimes starts early and sometimes will come late.  Hasn't skipped a cycle.    Has been with significant other for two years.  Patient's last menstrual period was 12/13/2016.          Sexually active: Yes.    The current method of family planning is vasectomy.    Exercising: No.  The patient does not participate in regular exercise at present. Smoker:  yes  Health Maintenance: Pap:  05/22/13 negative, HR HPV negative  History of abnormal Pap:  yes MMG:  08/19/16 BIRADS 1 negative  Colonoscopy:  never BMD:   never TDaP:  05/22/13  Pneumonia vaccine(s):  never Zostavax:   never Hep C testing: not indicated  Screening Labs: drawn today, Hb today: same, Urine today: trace RBC's   reports that she has been smoking Cigarettes.  She has a 10.00 pack-year smoking history. She has never used smokeless tobacco. She reports that she does not drink alcohol or use drugs.  Past Medical History:  Diagnosis Date  . History of palpitations   . Kidney stones    h/o-no surgery   Past Surgical History:  Procedure Laterality Date  . BREAST ENHANCEMENT SURGERY  2009  . [place]   left foot  . [place], 2001   x2  . LAPAROSCOPIC CHOLECYSTECTOMY  1995  . UMBILICAL HERNIA REPAIR N/A 11/01/2013   Procedure: HERNIA REPAIR UMBILICAL ADULT;  Surgeon: Adin Hector, MD;  Location: [facility];  Service: General;  Laterality: N/A;    Current Outpatient Prescriptions  Medication Sig Dispense Refill  . fish oil-omega-3 fatty acids 1000 MG capsule Take 2 g by mouth daily.     Marland Kitchen ibuprofen (ADVIL,MOTRIN) 400 MG tablet Take 1-2 tablets (400-800 mg total) by mouth every 6 (six) hours as needed for fever, headache, mild pain, moderate pain or cramping. 40 tablet 1  . Multiple Vitamins-Minerals (MULTIVITAMIN PO) Take by mouth daily.     No current facility-administered medications for this visit.     Family History  Problem Relation Age of Onset  . Hepatitis Mother   . Liver cancer Mother   . Cirrhosis Mother     liver transplant  . Kidney Stones Mother   . Cancer Father 73    lung and brain cancer  . Cancer Paternal Grandmother     stomach  . Alcoholism Paternal Grandfather   . Alcoholism Maternal Grandfather     ROS:  Pertinent items are noted in HPI.  Otherwise, a comprehensive ROS was negative.  Exam:   BP 100/60 (BP Location: Right Arm, Patient Position: Sitting, Cuff Size: Normal)   Pulse 62   Resp 12   Ht 5' 6.75" (1.695 m)   Wt 162 lb (73.5 kg)   LMP 12/13/2016   BMI 25.56 kg/m   Weight change: -2#   Height: 5' 6.75" (169.5 cm)  Ht Readings from Last 3 Encounters:  12/23/16 5' 6.75" (1.695 m)  06/11/15 5' 6.5" (1.689 m)  09/04/14 5' 6.75" (1.695  m)    General appearance: alert, cooperative and appears stated age Head: Normocephalic, without obvious abnormality, atraumatic Neck: no adenopathy, supple, symmetrical, trachea midline and thyroid normal to inspection and palpation Lungs: clear to auscultation bilaterally Breasts: normal appearance, no masses or tenderness Heart: regular rate and rhythm Abdomen: soft, non-tender; bowel sounds normal; no masses,  no organomegaly Extremities: extremities normal, atraumatic, no cyanosis or edema Skin: Skin color, texture, turgor normal. No rashes or lesions Lymph nodes: Cervical, supraclavicular, and axillary nodes normal. No abnormal inguinal nodes palpated Neurologic: Grossly normal   Pelvic: External genitalia:  no lesions              Urethra:  normal appearing urethra with no masses,  tenderness or lesions              Bartholins and Skenes: normal                 Vagina: normal appearing vagina with normal color and discharge, no lesions              Cervix: no lesions              Pap taken: Yes.   Bimanual Exam:  Uterus:  normal size, contour, position, consistency, mobility, non-tender              Adnexa: normal adnexa and no mass, fullness, tenderness               Rectovaginal: Confirms               Anus:  normal sphincter tone, no lesions  Chaperone was present for exam.  A:    Well Woman with normal exam Smoker Family hx of lung cancer in her father Chest wall pain Gastritis  P:         Mammogram guidelines reviewed.  3D d/w pt Pap and HR HPV obtained today CBC, CMP today FSH obtained today Trial of Carafate 10mg l (1gm) with meals and at night.  #423ml.  If improves symptoms, pt knows will need to follow up with GI at [place] for CXR, PA and lat given return annually or prn

## 2016-12-24 ENCOUNTER — Other Ambulatory Visit: Payer: Self-pay | Admitting: Obstetrics & Gynecology

## 2016-12-24 ENCOUNTER — Telehealth: Payer: Self-pay | Admitting: Obstetrics & Gynecology

## 2016-12-24 ENCOUNTER — Ambulatory Visit
Admission: RE | Admit: 2016-12-24 | Discharge: 2016-12-24 | Disposition: A | Discharge status: Home / Self Care | Payer: BLUE CROSS/BLUE SHIELD | Source: Ambulatory Visit | Attending: Obstetrics & Gynecology | Admitting: Obstetrics & Gynecology

## 2016-12-24 DIAGNOSIS — R0789 Other chest pain: Secondary | ICD-10-CM

## 2016-12-24 LAB — FOLLICLE STIMULATING HORMONE: FSH: 58.3 m[IU]/mL

## 2016-12-24 NOTE — Telephone Encounter (Signed)
The prescription was what her GI at [facility] wanted her to be on.  I typically do not see this used in anything but liquid so if cost is too much (and it may be) then she is going to need to follow up with her GI at [facility].  Thanks.

## 2016-12-24 NOTE — Telephone Encounter (Signed)
Spoke with Baxter Flattery, the pharmacists at [facility]. Was advised no generic for Carafate suspension only tablets. Was advised prescription sent was for 10 day supply and patient thought was for a 30 day supply. Advised will review with provider and return call to patient.  Dr. Sabra Heck -please advise?

## 2016-12-24 NOTE — Telephone Encounter (Signed)
Patient was seen yesterday and would like to change the prescription given yesterday to a generic. Confirmed pharmacy with patient.

## 2016-12-27 NOTE — Telephone Encounter (Signed)
Please let her know her CBC and CMP were also normal.  Her [place] was 58 which is in menopausal range.  If she has future bleeding, I'd like to know.  Her CXR was normal and I have no additional recommendations.  Thanks.

## 2016-12-27 NOTE — Telephone Encounter (Signed)
Spoke with patient, advised as seen below per Dr. Sabra Heck. Patient state she is surprised to know she is in menopause as she continues to have monthly cycles. Patient verbalizes understanding and is agreeable.  Routing to provider for final review. Patient is agreeable to disposition. Will close encounter.

## 2016-12-27 NOTE — Telephone Encounter (Signed)
Spoke with patient. Advised as seen below per Dr. Sabra Heck. Patient states she is no longer seeing GI at [facility] Patient states medication cost is $188 for 10 day supply. RN asked patient if referral is needed for GI, patient states not at this time. Patient asking if results from CXR 12/24/16 are back? Advised patient of CXR results as seen below. Advised patient will review with Dr. Sabra Heck and return call with any additional recommendations. Advised patient Dr. Sabra Heck is in surgery this morning, response may not be immediate.  Patient is agreeable.  Study Result   CLINICAL DATA:  Right-sided chest wall pain, cough.  EXAM: CHEST  2 VIEW  COMPARISON:  Radiographs of October 29, 2013.  FINDINGS: The heart size and mediastinal contours are within normal limits. Both lungs are clear. No pneumothorax or pleural effusion is noted. The visualized skeletal structures are unremarkable.  IMPRESSION: No active cardiopulmonary disease.   Electronically Signed   By: Marijo Conception, M.D.   On: 12/24/2016 15:56   Dr. Sabra Heck any additional recommendations?

## 2016-12-28 LAB — IPS PAP TEST WITH HPV

## 2016-12-30 ENCOUNTER — Encounter: Payer: Self-pay | Admitting: Obstetrics & Gynecology

## 2016-12-31 ENCOUNTER — Other Ambulatory Visit: Payer: Self-pay | Admitting: Obstetrics & Gynecology

## 2016-12-31 MED ORDER — METRONIDAZOLE 0.75 % VA GEL: 1.0000 | Freq: Every day | VAGINAL | 0 refills | Status: DC

## 2016-12-31 MED ORDER — FLUCONAZOLE 150 MG PO TABS: 150.0000 mg | ORAL_TABLET | Freq: Once | ORAL | 0 refills | Status: AC

## 2017-01-07 ENCOUNTER — Other Ambulatory Visit: Payer: Self-pay | Admitting: Obstetrics & Gynecology

## 2017-01-07 MED ORDER — METRONIDAZOLE 500 MG PO TABS: 500.0000 mg | ORAL_TABLET | Freq: Two times a day (BID) | ORAL | 0 refills | Status: DC

## 2017-02-08 ENCOUNTER — Encounter: Payer: Self-pay | Admitting: Obstetrics & Gynecology

## 2017-02-08 ENCOUNTER — Telehealth: Payer: Self-pay | Admitting: Obstetrics & Gynecology

## 2017-02-08 NOTE — Telephone Encounter (Signed)
According to Dr Ammie Ferrier note in January of 2018, she has still been cycling, some cycles early, some late, but no missed cycles. Her [place] was elevated, this goes along with peri-menopause. She should calendar her bleeding, call if bleeding closer than 21 days, going 60 days without a cycle, bleeding through a pad/tampon an hour or if bleeding for >7days in a row.

## 2017-02-08 NOTE — Telephone Encounter (Signed)
Dr. Talbert Nan, patient advised per telephone encounter dated 12/24/16 [facility] in menopausal range, call with future bleeding, please advise?   Cc: Dr. Sabra Heck

## 2017-02-08 NOTE — Telephone Encounter (Signed)
Patient calling to let Dr. Sabra Heck know she started her cycle on Sunday night.

## 2017-02-09 ENCOUNTER — Telehealth: Payer: Self-pay | Admitting: Obstetrics and Gynecology

## 2017-02-09 ENCOUNTER — Ambulatory Visit (INDEPENDENT_AMBULATORY_CARE_PROVIDER_SITE_OTHER): Payer: BLUE CROSS/BLUE SHIELD | Admitting: Obstetrics and Gynecology

## 2017-02-09 ENCOUNTER — Encounter: Payer: Self-pay | Admitting: Obstetrics and Gynecology

## 2017-02-09 VITALS — BP 110/70 | HR 72 | Resp 18 | Wt 162.0 lb

## 2017-02-09 DIAGNOSIS — R351 Nocturia: Secondary | ICD-10-CM | POA: Diagnosis not present

## 2017-02-09 DIAGNOSIS — R1031 Right lower quadrant pain: Secondary | ICD-10-CM

## 2017-02-09 DIAGNOSIS — R140 Abdominal distension (gaseous): Secondary | ICD-10-CM | POA: Diagnosis not present

## 2017-02-09 DIAGNOSIS — R5383 Other fatigue: Secondary | ICD-10-CM

## 2017-02-09 DIAGNOSIS — R102 Pelvic and perineal pain: Secondary | ICD-10-CM | POA: Diagnosis not present

## 2017-02-09 DIAGNOSIS — R350 Frequency of micturition: Secondary | ICD-10-CM

## 2017-02-09 LAB — POCT URINALYSIS DIPSTICK
Bilirubin, UA: NEGATIVE
Glucose, UA: NEGATIVE
Ketones, UA: NEGATIVE
LEUKOCYTES UA: NEGATIVE
NITRITE UA: NEGATIVE
Protein, UA: NEGATIVE
UROBILINOGEN UA: NEGATIVE
pH, UA: 7

## 2017-02-09 LAB — CBC
HCT: 41.4 % (ref 35.0–45.0)
Hemoglobin: 13.7 g/dL (ref 11.7–15.5)
MCH: 32.4 pg (ref 27.0–33.0)
MCHC: 33.1 g/dL (ref 32.0–36.0)
MCV: 97.9 fL (ref 80.0–100.0)
MPV: 10.5 fL (ref 7.5–12.5)
PLATELETS: 255 10*3/uL (ref 140–400)
RBC: 4.23 MIL/uL (ref 3.80–5.10)
RDW: 13.8 % (ref 11.0–15.0)
WBC: 8.7 10*3/uL (ref 3.8–10.8)

## 2017-02-09 LAB — TSH: TSH: 0.97 m[IU]/L

## 2017-02-09 LAB — POCT URINE PREGNANCY: PREG TEST UR: NEGATIVE

## 2017-02-09 NOTE — Progress Notes (Signed)
GYNECOLOGY  VISIT   HPI: 46 y.o.   Divorced  Caucasian  female   G2P2002 with Patient's last menstrual period was 02/06/2017.   here c/o urinary frequency and  night urination and RLQ pain. She c/o a 1 week h/o increased urinary frequency at night, nocturia 2-3 x a night over the last week. Voiding normal amounts. No dysuria. Normal urinary frequency during the day. No ETOH, no change in caffeine intake. 2 cups of coffee a day, then tea at dinner. Not drinking any more liquids than normal. She c/o abdominal bloating for the last 3-4 weeks, her pants are tight. No change in her weight.  Has never felt this bloated. She is having a BM qd, can vary from loose to firm (no change). She has a pinching pain in her lower abdomen to her labia on the left. Has pain in her right groin for the last 6 months intermittently. No bulge. The pain is severe, lasts for 2 seconds, stops her in her tracks, becoming more frequent, no pattern.  She does have some GSI, seems to be getting worse. Leaks small amounts, 2-3 x a week.  Periods have just started to get irregular. This last cycle was 60 days, prior to this it has been monthly. She c/o SOB for the last 3 weeks. She has decreased her smoking to 1/4 of a pack of cigarettes a day (since January), trying to quit.    She is also c/o feeling SHOB  GYNECOLOGIC HISTORY: Patient's last menstrual period was 02/06/2017. Contraception:vasectomy  Menopausal hormone therapy: none        OB History    Gravida Para Term Preterm AB Living   2 2 2     2    SAB TAB Ectopic Multiple Live Births                     Patient Active Problem List   Diagnosis Date Noted  . Laryngopharyngeal reflux 09/23/2014  . Tobacco abuse 10/01/2013  . Incisional hernia - periumbilical 99991111  . PVC's (premature ventricular contractions) 09/14/2013    Past Medical History:  Diagnosis Date  . History of palpitations   . Kidney stones    h/o-no surgery    Past Surgical History:   Procedure Laterality Date  . BREAST ENHANCEMENT SURGERY  2009  . [place]   left foot  . [place], 2001   x2  . LAPAROSCOPIC CHOLECYSTECTOMY  1995  . UMBILICAL HERNIA REPAIR N/A 11/01/2013   Procedure: HERNIA REPAIR UMBILICAL ADULT;  Surgeon: Adin Hector, MD;  Location: [facility];  Service: General;  Laterality: N/A;    Current Outpatient Prescriptions  Medication Sig Dispense Refill  . fish oil-omega-3 fatty acids 1000 MG capsule Take 2 g by mouth daily.    Marland Kitchen ibuprofen (ADVIL,MOTRIN) 400 MG tablet Take 1-2 tablets (400-800 mg total) by mouth every 6 (six) hours as needed for fever, headache, mild pain, moderate pain or cramping. 40 tablet 1  . Multiple Vitamins-Minerals (MULTIVITAMIN PO) Take by mouth daily.     No current facility-administered medications for this visit.      ALLERGIES: Nickel  Family History  Problem Relation Age of Onset  . Hepatitis Mother   . Liver cancer Mother   . Cirrhosis Mother     liver transplant  . Kidney Stones Mother   . Lung cancer Father 39    brain metastasis, being treated at [facility]  . Stomach cancer  Paternal Grandmother        . Alcoholism Paternal Grandfather   . Alcoholism Maternal Grandfather     Social History   Social History  . Marital status: Divorced    Spouse name: n/a  . Number of children: 2  . Years of education: 15   Occupational History  . project Information systems manager   Social History Main Topics  . Smoking status: Heavy Tobacco Smoker    Packs/day: 0.50    Years: 20.00    Types: Cigarettes  . Smokeless tobacco: Never Used     Comment: recent increase from 1/2 ppd; "I've quit so many times."  . Alcohol use No  . Drug use: No  . Sexual activity: Yes    Partners: Male     Comment: vasectomy   Other Topics Concern  . Not on file   Social History Narrative   Lives with her youngest daughter. Her older daughter lives in [place], [place].    Review of  Systems  Constitutional: Negative.   HENT: Negative.   Eyes: Negative.   Respiratory: Positive for shortness of breath.   Cardiovascular: Negative.   Gastrointestinal:       Bloating  Genitourinary: Positive for frequency.       RLQ pain Night urination Loss of urine with sneeze  Musculoskeletal: Negative.   Skin: Negative.   Neurological: Negative.   Endo/Heme/Allergies: Negative.   Psychiatric/Behavioral: Negative.     PHYSICAL EXAMINATION:    BP 110/70 (BP Location: Right Arm, Patient Position: Sitting, Cuff Size: Normal)   Pulse 72   Resp 18   Wt 162 lb (73.5 kg)   LMP 02/06/2017   BMI 25.56 kg/m     General appearance: alert, cooperative and appears stated age Neck: no adenopathy, supple, symmetrical, trachea midline and thyroid normal to inspection and palpation CV: regular rate and rhythm, no murmur Lungs: clear to auscultation bilaterally  Abdomen: soft, mildly tender BLQ, no rebound, no guarding. She is distended; bowel sounds normal; no masses,  no organomegaly  Pelvic: External genitalia:  no lesions              Urethra:  normal appearing urethra with no masses, tenderness or lesions              Bartholins and Skenes: normal                 Vagina: normal appearing vagina with normal color and discharge, no lesions              Cervix: no lesions              Bimanual Exam:  Uterus:  normal size, contour, position, consistency, mobility, non-tender              Adnexa: She has a tender mass in front of her uterus, possible bowel. Initially I thought her bladder was tender, then realized it was this mass. Bladder not tender              Rectovaginal: Yes.  .  Confirms.              Anus:  normal sphincter tone, no lesions  Chaperone was present for exam.  Urine dip: ++ blood (on her cycle), otherwise negative.   ASSESSMENT Urinary frequency at night, nocturia just in the last week Bloating, abdominal/pelvic pain discomfort for the last 3-4 weeks Right  groin pain Fatigue Shortness of breath with exertion Just had her first skipped cycle  PLAN UPT negative Ua, C&S CBC, TSH Return for a GYN ultrasound Will need to f/u with a primary MD for her [facility] Will discuss with radiology the best way to image her groin (right) Ibuprofen as needed for pain (OTC)   An After Visit Summary was printed and given to the patient.  Over 25 minutes face to face time of which over 50% was spent in counseling.   CC: Dr Sabra Heck

## 2017-02-09 NOTE — Telephone Encounter (Signed)
Return call to patient, unable to leave message, mailbox full.

## 2017-02-09 NOTE — Telephone Encounter (Signed)
Spoke with patient, advised as seen below per Dr. Talbert Nan. Patient states she has been experiencing bloating and fullness for last 2-3 weeks and does not know why. Patient reports pain in groin on right side at bikini line. Patient states the pain is not pelvic pain, more in groin. Patient reports pain stops her in her tracks, is weird and bothersome. Patient reports urinary frequency, wakes up 3 times during night to urinate. Patient denies fever, vaginal discharge or odor. Patient reports was treated end of January with oral flagyl for BV. Recommended OV for further evaluation, advised patient Dr. Sabra Heck out of office this week, can schedule with covering provider. Patient scheduled for today, 2/28 at 10am with Dr. Talbert Nan. Patient is agreeable to date and time.  Routing to provider for final review. Patient is agreeable to disposition. Will close encounter.   Cc: Dr. Sabra Heck

## 2017-02-09 NOTE — Telephone Encounter (Signed)
Patient noticed missed call on her phone and returned our call. Spoke with patient regarding benefit for ultraosund. Patient understood and agreeable. Patient scheduled 02/10/17 with Dr Talbert Nan. Patient aware of date, arrival time and cancellation policy. No further questions. Ok to close

## 2017-02-09 NOTE — Telephone Encounter (Signed)
See telephone encounter dated 02/08/17. Patient evaluated in office 02/09/17.

## 2017-02-09 NOTE — Telephone Encounter (Signed)
Called patient to review benefits for a recommended ultrasound. Unable to leave a voicemail, due to mailbox being full.

## 2017-02-10 ENCOUNTER — Ambulatory Visit (INDEPENDENT_AMBULATORY_CARE_PROVIDER_SITE_OTHER): Payer: BLUE CROSS/BLUE SHIELD | Admitting: Obstetrics and Gynecology

## 2017-02-10 ENCOUNTER — Encounter: Payer: Self-pay | Admitting: Obstetrics and Gynecology

## 2017-02-10 ENCOUNTER — Ambulatory Visit (INDEPENDENT_AMBULATORY_CARE_PROVIDER_SITE_OTHER): Payer: BLUE CROSS/BLUE SHIELD

## 2017-02-10 VITALS — BP 120/68 | HR 64 | Resp 16 | Wt 162.0 lb

## 2017-02-10 DIAGNOSIS — R1031 Right lower quadrant pain: Secondary | ICD-10-CM | POA: Diagnosis not present

## 2017-02-10 DIAGNOSIS — R351 Nocturia: Secondary | ICD-10-CM

## 2017-02-10 DIAGNOSIS — R102 Pelvic and perineal pain: Secondary | ICD-10-CM

## 2017-02-10 DIAGNOSIS — R140 Abdominal distension (gaseous): Secondary | ICD-10-CM

## 2017-02-10 LAB — URINALYSIS, MICROSCOPIC ONLY
BACTERIA UA: NONE SEEN [HPF]
CRYSTALS: NONE SEEN [HPF]
Casts: NONE SEEN [LPF]
Squamous Epithelial / LPF: NONE SEEN [HPF] (ref <=5)
WBC, UA: NONE SEEN WBC/HPF (ref <=5)
Yeast: NONE SEEN [HPF]

## 2017-02-10 NOTE — Progress Notes (Signed)
GYNECOLOGY  VISIT   HPI: 46 y.o.   Divorced  Caucasian  female   G2P2002 with Patient's last menstrual period was 02/06/2017.   here for follow up pelvic pain. The patient was seen yesterday with c/o abdominal bloating, nocturia, right groin pain, abdominal/pelvic discomfort.  She had a normal urinalysis, culture pending, normal TSH and CBC. She is here for an ultrasound.   GYNECOLOGIC HISTORY: Patient's last menstrual period was 02/06/2017. Contraception: Vasectomy  Menopausal hormone therapy: none         OB History    Gravida Para Term Preterm AB Living   2 2 2     2    SAB TAB Ectopic Multiple Live Births                     Patient Active Problem List   Diagnosis Date Noted  . Laryngopharyngeal reflux 09/23/2014  . Tobacco abuse 10/01/2013  . Incisional hernia - periumbilical 99991111  . PVC's (premature ventricular contractions) 09/14/2013    Past Medical History:  Diagnosis Date  . History of palpitations   . Kidney stones    h/o-no surgery    Past Surgical History:  Procedure Laterality Date  . BREAST ENHANCEMENT SURGERY  2009  . [place]   left foot  . [place], 2001   x2  . LAPAROSCOPIC CHOLECYSTECTOMY  1995  . UMBILICAL HERNIA REPAIR N/A 11/01/2013   Procedure: HERNIA REPAIR UMBILICAL ADULT;  Surgeon: Adin Hector, MD;  Location: [facility];  Service: General;  Laterality: N/A;    Current Outpatient Prescriptions  Medication Sig Dispense Refill  . fish oil-omega-3 fatty acids 1000 MG capsule Take 2 g by mouth daily.    Marland Kitchen ibuprofen (ADVIL,MOTRIN) 400 MG tablet Take 1-2 tablets (400-800 mg total) by mouth every 6 (six) hours as needed for fever, headache, mild pain, moderate pain or cramping. 40 tablet 1  . Multiple Vitamins-Minerals (MULTIVITAMIN PO) Take by mouth daily.     No current facility-administered medications for this visit.      ALLERGIES: Nickel  Family History  Problem Relation Age of Onset  . Hepatitis  Mother   . Liver cancer Mother   . Cirrhosis Mother     liver transplant  . Kidney Stones Mother   . Lung cancer Father 56    brain metastasis, being treated at [facility]  . Stomach cancer Paternal Grandmother        . Alcoholism Paternal Grandfather   . Alcoholism Maternal Grandfather     Social History   Social History  . Marital status: Divorced    Spouse name: n/a  . Number of children: 2  . Years of education: 15   Occupational History  . project Information systems manager   Social History Main Topics  . Smoking status: Heavy Tobacco Smoker    Packs/day: 0.50    Years: 20.00    Types: Cigarettes  . Smokeless tobacco: Never Used     Comment: recent increase from 1/2 ppd; "I've quit so many times."  . Alcohol use No  . Drug use: No  . Sexual activity: Yes    Partners: Male     Comment: vasectomy   Other Topics Concern  . Not on file   Social History Narrative   Lives with her youngest daughter. Her older daughter lives in [place], [place].    Review of Systems  Constitutional: Negative.   HENT: Negative.   Eyes: Negative.  Respiratory: Negative.   Cardiovascular: Negative.   Gastrointestinal:       Bloating  Genitourinary: Negative.   Musculoskeletal: Negative.   Skin: Negative.   Neurological: Negative.   Endo/Heme/Allergies: Negative.   Psychiatric/Behavioral: Negative.     PHYSICAL EXAMINATION:    BP 120/68 (BP Location: Right Arm, Patient Position: Sitting, Cuff Size: Normal)   Pulse 64   Resp 16   Wt 162 lb (73.5 kg)   LMP 02/06/2017   BMI 25.56 kg/m     General appearance: alert, cooperative and appears stated age  Ultrasound images were reviewed with the patient  ASSESSMENT Bloating, abdominal pelvic discomfort, given her pelvic ultrasound, her symptoms are c/w a GI etiology Nocturia 3.4 cm simple left ovarian cyst, c/w follicular cyst Right groin pain, negative ultrasound of the right groin    PLAN Try gas-x or  beano Decrease fluid intake close to bedtime F/U with primary MD for her groin pain (and SHOB) Reassured that her pelvic ultrasound was normal.    An After Visit Summary was printed and given to the patient.

## 2017-02-11 LAB — URINE CULTURE: Organism ID, Bacteria: NO GROWTH

## 2017-08-01 ENCOUNTER — Other Ambulatory Visit: Payer: Self-pay | Admitting: Obstetrics & Gynecology

## 2017-08-01 DIAGNOSIS — Z1231 Encounter for screening mammogram for malignant neoplasm of breast: Secondary | ICD-10-CM

## 2017-08-02 ENCOUNTER — Emergency Department (HOSPITAL_COMMUNITY)
Admission: EM | Admit: 2017-08-02 | Discharge: 2017-08-02 | Discharge status: Against Medical Advice | Payer: BLUE CROSS/BLUE SHIELD | Attending: Emergency Medicine | Admitting: Emergency Medicine

## 2017-08-02 ENCOUNTER — Telehealth: Payer: Self-pay | Admitting: Physician Assistant

## 2017-08-02 ENCOUNTER — Encounter (HOSPITAL_COMMUNITY): Payer: Self-pay | Admitting: Nurse Practitioner

## 2017-08-02 DIAGNOSIS — Z5321 Procedure and treatment not carried out due to patient leaving prior to being seen by health care provider: Secondary | ICD-10-CM | POA: Insufficient documentation

## 2017-08-02 DIAGNOSIS — R079 Chest pain, unspecified: Secondary | ICD-10-CM | POA: Insufficient documentation

## 2017-08-02 LAB — BASIC METABOLIC PANEL
ANION GAP: 9 (ref 5–15)
BUN: 13 mg/dL (ref 6–20)
CALCIUM: 8.9 mg/dL (ref 8.9–10.3)
CO2: 22 mmol/L (ref 22–32)
CREATININE: 0.6 mg/dL (ref 0.44–1.00)
Chloride: 107 mmol/L (ref 101–111)
GFR calc non Af Amer: >60 mL/min (ref >60)
Glucose, Bld: 99 mg/dL (ref 65–99)
Potassium: 3.9 mmol/L (ref 3.5–5.1)
SODIUM: 138 mmol/L (ref 135–145)

## 2017-08-02 LAB — I-STAT TROPONIN, ED: TROPONIN I, POC: 0 ng/mL (ref 0.00–0.08)

## 2017-08-02 LAB — CBC
HCT: 38.6 % (ref 36.0–46.0)
HEMOGLOBIN: 13.2 g/dL (ref 12.0–15.0)
MCH: 32.2 pg (ref 26.0–34.0)
MCHC: 34.2 g/dL (ref 30.0–36.0)
MCV: 94.1 fL (ref 78.0–100.0)
PLATELETS: 241 10*3/uL (ref 150–400)
RBC: 4.1 MIL/uL (ref 3.87–5.11)
RDW: 12.3 % (ref 11.5–15.5)
WBC: 9.7 10*3/uL (ref 4.0–10.5)

## 2017-08-02 NOTE — ED Notes (Signed)
No answer for treatment room. LWBS.

## 2017-08-02 NOTE — Telephone Encounter (Signed)
New message       Pt c/o of Chest Pain: 1. Are you having CP right now?  Yes----chest tightness 2. Are you experiencing any other symptoms (ex. SOB, nausea, vomiting, sweating)?  Tired--went to urgent care last week 3. How long have you been experiencing CP? Off and on for a week or two 4. Is your CP continuous or coming and going?  Comes and goes 5. Have you taken Nitroglycerin?  no

## 2017-08-02 NOTE — ED Triage Notes (Signed)
Pt presents with c/o chest pain. The pain began about two weeks ago and has been intermittent since. The pain is a dull aching across her chest that sometimes radiates into her left neck and jaw. She reports malaise, cough, shortness of breath. She denies fevers, nausea, vomiting. She was seen at a walk in clinic last week and had an ekg, labwork and chest xray with no abnormal findings.

## 2017-08-02 NOTE — ED Notes (Signed)
Called patient 3x, no response. 

## 2017-08-02 NOTE — Telephone Encounter (Signed)
Patient said that for the past 2 weeks, she has had an aching, constant chest tightness rated 2-4/10. Patient also c/o lightheadedness especially with walking up steps and changing positions. Patient has not taken anything for the chest tightness. Patient said she takes an 81 mg aspirin daily. No c/o sob. Patient said she went to [facility] last week and had EKG, CXR done. Patient advised to go to the ED for an evaluation. Patient agreed and said she would call back tomorrow to schedule an appointment if she needs one.

## 2017-08-04 ENCOUNTER — Encounter: Payer: Self-pay | Admitting: Physician Assistant

## 2017-08-18 ENCOUNTER — Ambulatory Visit (INDEPENDENT_AMBULATORY_CARE_PROVIDER_SITE_OTHER): Payer: BLUE CROSS/BLUE SHIELD | Admitting: Physician Assistant

## 2017-08-18 ENCOUNTER — Encounter: Payer: Self-pay | Admitting: Physician Assistant

## 2017-08-18 VITALS — BP 108/78 | HR 76 | Ht 66.5 in | Wt 165.1 lb

## 2017-08-18 DIAGNOSIS — R1310 Dysphagia, unspecified: Secondary | ICD-10-CM | POA: Diagnosis not present

## 2017-08-18 DIAGNOSIS — R0789 Other chest pain: Secondary | ICD-10-CM | POA: Diagnosis not present

## 2017-08-18 DIAGNOSIS — R6889 Other general symptoms and signs: Secondary | ICD-10-CM | POA: Diagnosis not present

## 2017-08-18 DIAGNOSIS — R0989 Other specified symptoms and signs involving the circulatory and respiratory systems: Secondary | ICD-10-CM

## 2017-08-18 MED ORDER — FAMOTIDINE 40 MG PO TABS: ORAL_TABLET | ORAL | 3 refills | Status: DC

## 2017-08-18 NOTE — Patient Instructions (Signed)
You have been scheduled for a Barium Esophogram at [facility] (1st floor of the hospital) on Thursday 08-30-2017 at 10:30 am. Please arrive at 10:15 minutes prior to your appointment for registration. Make certain not to have anything to eat or drink 3 hours prior to your test. If you need to reschedule for any reason, please contact radiology at [phone] to do so.   We sent a prescription for Pepcid 40 mg, take 1 daily at lunchtime. __________________________________________________________________ A barium swallow is an examination that concentrates on views of the esophagus. This tends to be a double contrast exam (barium and two liquids which, when combined, create a gas to distend the wall of the oesophagus) or single contrast (non-ionic iodine based). The study is usually tailored to your symptoms so a good history is essential. Attention is paid during the study to the form, structure and configuration of the esophagus, looking for functional disorders (such as aspiration, dysphagia, achalasia, motility and reflux) EXAMINATION You may be asked to change into a gown, depending on the type of swallow being performed. A radiologist and radiographer will perform the procedure. The radiologist will advise you of the type of contrast selected for your procedure and direct you during the exam. You will be asked to stand, sit or lie in several different positions and to hold a small amount of fluid in your mouth before being asked to swallow while the imaging is performed .In some instances you may be asked to swallow barium coated marshmallows to assess the motility of a solid food bolus. The exam can be recorded as a digital or video fluoroscopy procedure. POST PROCEDURE It will take 1-2 days for the barium to pass through your system. To facilitate this, it is important, unless otherwise directed, to increase your fluids for the next 24-48hrs and to resume your normal diet.  This test  typically takes about 30 minutes to perform. __________________________________________________________________________________

## 2017-08-18 NOTE — Progress Notes (Signed)
Subjective:    Patient ID: Shelly Blackwell, female    DOB: 30-May-1971, 46 y.o.   MRN: [id number]  HPI Shelly Blackwell is a 46 year old white female, known very remotely to Dr. Fuller Plan who comes in today with complaints of tightness in her throat and mild dysphagia. She had undergone an EGD in 2005 which was normal. She is status post cholecystectomy, long history of smoking which she says she stopped about 2 weeks ago. Patient mentions that she did have a workup in 2016 by GI/Dr. Delrae Alfred at [facility] for similar symptoms. She did not wish to return there for care. She says that she was told that she had gastritis and a spastic esophagus. She brought a copy of her endoscopy report which showed the esophageal mucosa to be normal with no evidence of inflammation or Barrett's esophagus was spastic, LES widely patent I her stomach was erythematous with bile noted throughout the stomach. Biopsies were taken showing no evidence of Barrett's and no H. pylori. She remembers taking a course of Carafate and viscous lidocaine. She has no current complaints of abdominal pain or gastritis type symptoms. She comes in with 1-2 month history of recurrent issues with tightness in her throat. She says sometimes when she is lying down at night she feels a "catch" in her throat like there is something stuck. She will have some tightness in her chest periodically throughout the day. She says she has had cardiac evaluation which was negative. She has no odynophagia but says sometimes with swallowing she has some mild dysphagia and a sensation of tightness as if food traverses. She's not on any regular PPI therapy and denies any regular heartburn indigestion or sour brash type symptoms. She specifically states she would like to avoid PPIs if possible. No regular aspirin or NSAID use  Review of Systems Pertinent positive and negative review of systems were noted in the above HPI section.  All other review of systems was otherwise  negative.  Outpatient Encounter Prescriptions as of 08/18/2017  Medication Sig  . ibuprofen (ADVIL,MOTRIN) 400 MG tablet Take 1-2 tablets (400-800 mg total) by mouth every 6 (six) hours as needed for fever, headache, mild pain, moderate pain or cramping.  . Multiple Vitamins-Minerals (MULTIVITAMIN PO) Take by mouth daily.  . famotidine (PEPCID) 40 MG tablet Take 1 tab daily by mouth at lunchtime.  . [DISCONTINUED] fish oil-omega-3 fatty acids 1000 MG capsule Take 2 g by mouth daily.   No facility-administered encounter medications on file as of 08/18/2017.    Allergies  Allergen Reactions  . Nickel Rash    Skin reaction with contact to nickel   Patient Active Problem List   Diagnosis Date Noted  . Laryngopharyngeal reflux 09/23/2014  . Tobacco abuse 10/01/2013  . Incisional hernia - periumbilical 40/98/1191  . PVC's (premature ventricular contractions) 09/14/2013   Social History   Social History  . Marital status: Divorced    Spouse name: n/a  . Number of children: 2  . Years of education: 15   Occupational History  . project Information systems manager   Social History Main Topics  . Smoking status: Former Smoker    Packs/day: 0.50    Years: 20.00    Types: Cigarettes    Quit date: 08/08/2017  . Smokeless tobacco: Never Used     Comment: quit 07/2017, but uses e cig  . Alcohol use No  . Drug use: No  . Sexual activity: Not on file   Other Topics Concern  .  Not on file   Social History Narrative   Lives with her youngest daughter. Her older daughter lives in [place], [place].    Ms. Gadbois family history includes Alcoholism in her maternal grandfather and paternal grandfather; Cirrhosis in her mother; Esophageal cancer in her paternal uncle; Hepatitis in her mother; Kidney Stones in her mother; Liver cancer in her mother; Lung cancer (age of onset: 61) in her father; Stomach cancer in her paternal grandmother.      Objective:    Vitals:   08/18/17 0905    BP: 108/78  Pulse: 76    Physical Exam  well-developed white female in no acute distress, pleasant blood pressure 108/78 pulse 76, height 5 foot 6, weight 165, BMI 26.2. HEENT; nontraumatic normocephalic EOMI PERRLA sclera anicteric,, neck is supple, no palpable adenopathy, thyroid not enlarged, Cardiovascular ;regular rate and rhythm with S1-S2 no murmur or gallop, Pulmonary ;clear bilaterally, Abdomen; soft nontender no palpable mass or hepatosplenomegaly, Extremities; no clubbing cyanosis or edema skin warm and dry, Neuropsych; mood and affect appropriate       Assessment & Plan:   #43 46 year old white female with recurrent symptoms of throat and chest tightness, and sensation of dysphagia. EGD done in 2016 at [facility] with no evidence of esophagitis or esophageal stricture, it was commented that she had a spastic esophagus with patent LES Suspect symptoms may be secondary to underlying motility disorder, rule out DES, rule out nutcracker esophagus  #2 status post cholecystectomy #3 history of gastritis/possibly bile induced  Plan; Will schedule patient for barium swallow with a tablet. Start Pepcid 40 mg by mouth once daily, she will take mid-day as her symptoms seem to worsen later in the day. Pending on barium swallow results she may need manometry.   Sylvia Helms S Philomina Leon PA-C 08/18/2017   Cc: No ref. provider found

## 2017-08-19 NOTE — Progress Notes (Signed)
Reviewed and agree with initial management plan.  Ervin Hensley T. Janyia Guion, MD FACG 

## 2017-08-22 ENCOUNTER — Ambulatory Visit
Admission: RE | Admit: 2017-08-22 | Discharge: 2017-08-22 | Disposition: A | Discharge status: Home / Self Care | Payer: BLUE CROSS/BLUE SHIELD | Source: Ambulatory Visit | Attending: Obstetrics & Gynecology | Admitting: Obstetrics & Gynecology

## 2017-08-22 DIAGNOSIS — Z1231 Encounter for screening mammogram for malignant neoplasm of breast: Secondary | ICD-10-CM

## 2017-08-30 ENCOUNTER — Ambulatory Visit (HOSPITAL_COMMUNITY)
Admission: RE | Admit: 2017-08-30 | Discharge: 2017-08-30 | Disposition: A | Discharge status: Home / Self Care | Payer: BLUE CROSS/BLUE SHIELD | Source: Ambulatory Visit | Attending: Physician Assistant | Admitting: Physician Assistant

## 2017-08-30 DIAGNOSIS — R0789 Other chest pain: Secondary | ICD-10-CM | POA: Diagnosis present

## 2017-08-30 DIAGNOSIS — R6889 Other general symptoms and signs: Secondary | ICD-10-CM | POA: Diagnosis present

## 2017-08-30 DIAGNOSIS — R1310 Dysphagia, unspecified: Secondary | ICD-10-CM | POA: Diagnosis present

## 2017-08-30 DIAGNOSIS — K224 Dyskinesia of esophagus: Secondary | ICD-10-CM | POA: Insufficient documentation

## 2017-08-30 DIAGNOSIS — R0989 Other specified symptoms and signs involving the circulatory and respiratory systems: Secondary | ICD-10-CM

## 2017-09-02 ENCOUNTER — Encounter: Payer: Self-pay | Admitting: Physician Assistant

## 2017-09-05 ENCOUNTER — Other Ambulatory Visit: Payer: Self-pay

## 2017-09-05 DIAGNOSIS — K224 Dyskinesia of esophagus: Secondary | ICD-10-CM

## 2017-09-19 ENCOUNTER — Ambulatory Visit (HOSPITAL_COMMUNITY)
Admission: RE | Admit: 2017-09-19 | Discharge: 2017-09-19 | Disposition: A | Discharge status: Home / Self Care | Payer: BLUE CROSS/BLUE SHIELD | Source: Ambulatory Visit | Attending: Gastroenterology | Admitting: Gastroenterology

## 2017-09-19 ENCOUNTER — Encounter (HOSPITAL_COMMUNITY)
Admission: RE | Disposition: A | Discharge status: Home / Self Care | Payer: Self-pay | Source: Ambulatory Visit | Attending: Gastroenterology

## 2017-09-19 DIAGNOSIS — R079 Chest pain, unspecified: Secondary | ICD-10-CM | POA: Insufficient documentation

## 2017-09-19 DIAGNOSIS — R1310 Dysphagia, unspecified: Secondary | ICD-10-CM

## 2017-09-19 HISTORY — PX: ESOPHAGEAL MANOMETRY: SHX5429

## 2017-09-19 SURGERY — MANOMETRY, ESOPHAGUS

## 2017-09-19 MED ORDER — LIDOCAINE VISCOUS 2 % MT SOLN
OROMUCOSAL | Status: AC
  Filled 2017-09-19: qty 15

## 2017-09-19 SURGICAL SUPPLY — 2 items
FACESHIELD LNG OPTICON STERILE (SAFETY) IMPLANT
GLOVE BIO SURGEON STRL SZ8 (GLOVE) ×6 IMPLANT

## 2017-09-19 NOTE — Progress Notes (Signed)
Esophageal Manometry done per protocol. Pt tolerated well without complication or trouble.  Report to be sent to Dr. Silverio Decamp to read.

## 2017-09-20 ENCOUNTER — Encounter (HOSPITAL_COMMUNITY): Payer: Self-pay | Admitting: Gastroenterology

## 2017-09-22 DIAGNOSIS — R1310 Dysphagia, unspecified: Secondary | ICD-10-CM

## 2017-09-26 ENCOUNTER — Telehealth: Payer: Self-pay | Admitting: Gastroenterology

## 2017-09-26 NOTE — Telephone Encounter (Signed)
The report is in EPIC. Please advise and I can call the patient.

## 2017-09-27 NOTE — Telephone Encounter (Signed)
Patient reports she has been taking smaller bites and following with liquid. She states it does help some. She did not take Pepcid. Patient asks what is her treatment plan. Thank you

## 2017-09-27 NOTE — Telephone Encounter (Signed)
Please let pt know the esophageal manometry was normal , normal peristalsis , and normal relaxation of EG junction.

## 2017-09-27 NOTE — Telephone Encounter (Signed)
Pt is calling back about her results

## 2017-09-30 ENCOUNTER — Other Ambulatory Visit: Payer: Self-pay

## 2017-09-30 MED ORDER — HYOSCYAMINE SULFATE ER 0.375 MG PO TB12: 0.3750 mg | ORAL_TABLET | Freq: Every day | ORAL | 1 refills | Status: DC

## 2017-09-30 NOTE — Telephone Encounter (Signed)
I would like her to continue Pepcid 40 mg  Daily, lets try antispasmotic, levsin once daily again mid day as sxs worse later in day see if helps  #30/3 refills - and would like her to have a follow up with Dr Fuller Plan

## 2017-10-03 NOTE — Telephone Encounter (Signed)
Left a message at her home number. Appointment scheduled without consulting her. May have to cancel. Asked the patient to call or email back to discuss.

## 2017-10-05 NOTE — Telephone Encounter (Signed)
Patient message sent through My Chart.

## 2017-10-10 NOTE — Telephone Encounter (Signed)
Letter to the patient. 

## 2017-10-24 ENCOUNTER — Ambulatory Visit: Payer: BLUE CROSS/BLUE SHIELD | Admitting: Gastroenterology

## 2018-03-23 ENCOUNTER — Ambulatory Visit: Payer: BLUE CROSS/BLUE SHIELD | Admitting: Obstetrics & Gynecology

## 2018-04-07 ENCOUNTER — Ambulatory Visit (INDEPENDENT_AMBULATORY_CARE_PROVIDER_SITE_OTHER): Payer: BLUE CROSS/BLUE SHIELD | Admitting: Obstetrics and Gynecology

## 2018-04-07 ENCOUNTER — Other Ambulatory Visit: Payer: Self-pay

## 2018-04-07 ENCOUNTER — Encounter: Payer: Self-pay | Admitting: Obstetrics and Gynecology

## 2018-04-07 VITALS — BP 104/68 | HR 72 | Resp 14 | Ht 66.75 in | Wt 152.0 lb

## 2018-04-07 DIAGNOSIS — Z1211 Encounter for screening for malignant neoplasm of colon: Secondary | ICD-10-CM

## 2018-04-07 DIAGNOSIS — E559 Vitamin D deficiency, unspecified: Secondary | ICD-10-CM

## 2018-04-07 DIAGNOSIS — Z0000 Encounter for general adult medical examination without abnormal findings: Secondary | ICD-10-CM

## 2018-04-07 DIAGNOSIS — Z01419 Encounter for gynecological examination (general) (routine) without abnormal findings: Secondary | ICD-10-CM | POA: Diagnosis not present

## 2018-04-07 DIAGNOSIS — N644 Mastodynia: Secondary | ICD-10-CM

## 2018-04-07 DIAGNOSIS — N914 Secondary oligomenorrhea: Secondary | ICD-10-CM | POA: Diagnosis not present

## 2018-04-07 NOTE — Patient Instructions (Signed)

## 2018-04-07 NOTE — Progress Notes (Signed)
Patient scheduled while in office for left breast Dx MMG and [place], if needed. Spoke with Tonya at [facility], patient scheduled for 04/11/18 arriving at 7:20am for 7:40am appt. Patient agreeable to date and time.

## 2018-04-07 NOTE — Progress Notes (Signed)
47 y.o. [id number] DivorcedCaucasianF here for annual exam.  Cycles are q 2 months over the last 6 months. Change from monthly. No vasomotor symptoms, no vaginal dryness. Occasional deep dyspareunia, positional.  Period Duration (Days): 4 days  Period Pattern: (!) Irregular Menstrual Flow: Moderate, Light Menstrual Control: Tampon Menstrual Control Change Freq (Hours): changes tampon 4 times a day  Dysmenorrhea: None  She gets occasional shooting pain in the left breast over the last 2 months. Always in the same area. She has implants and has a h/o distant h/o cysts (not mentioned in the last few years). She drinks 3 cups of coffee a day.   Patient's last menstrual period was 03/24/2018.          Sexually active: Yes.    The current method of family planning is vasectomy.    Exercising: No.  The patient does not participate in regular exercise at present. Smoker:  E-Cigs, she stopped smoking cigarettes, the e-cig have helped. She is weaning down on her nicotine in stages. Planning to quit.   Health Maintenance: Pap:  12-23-16 WNL NEG HR HPV 05-22-13 WNL NEG HR HPV  History of abnormal Pap:  no MMG:  08-22-17 WNL  Colonoscopy:  Never BMD:   Never TDaP:  08-22-17 Gardasil: N/A   reports that she quit smoking about 7 months ago. Her smoking use included cigarettes. She has a 10.00 pack-year smoking history. She has never used smokeless tobacco. 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 Kids are 24 and 35 (both girls). Older daughter is in [place], has some cognitive and mental handicaps.  Lamesha is in the process of adopting her grandson, 51 months old. She also has a 2 year old granddaughter who lives with her Dad.   Past Medical History:  Diagnosis Date  . History of palpitations   . Kidney stones    h/o-no surgery    Past Surgical History:  Procedure Laterality Date  . AUGMENTATION MAMMAPLASTY Bilateral    2008  .  BREAST ENHANCEMENT SURGERY  2009  . [place]   left foot  . [place], 2001   x2  . ESOPHAGEAL MANOMETRY N/A 09/19/2017   Procedure: ESOPHAGEAL MANOMETRY (EM);  Surgeon: Mauri Pole, MD;  Location: [facility];  Service: Endoscopy;  Laterality: N/A;  . LAPAROSCOPIC CHOLECYSTECTOMY  1995  . UMBILICAL HERNIA REPAIR N/A 11/01/2013   Procedure: HERNIA REPAIR UMBILICAL ADULT;  Surgeon: Adin Hector, MD;  Location: [facility];  Service: General;  Laterality: N/A;    Current Outpatient Medications  Medication Sig Dispense Refill  . ibuprofen (ADVIL,MOTRIN) 400 MG tablet Take 1-2 tablets (400-800 mg total) by mouth every 6 (six) hours as needed for fever, headache, mild pain, moderate pain or cramping. 40 tablet 1  . Multiple Vitamins-Minerals (MULTIVITAMIN PO) Take by mouth daily.     No current facility-administered medications for this visit.     Family History  Problem Relation Age of Onset  . Hepatitis Mother   . Liver cancer Mother   . Cirrhosis Mother        liver transplant  . Kidney Stones Mother   . Lung cancer Father 35       brain metastasis, being [facility] at [facility]  . Stomach cancer Paternal Grandmother           . Alcoholism Paternal Grandfather   . Alcoholism Maternal Grandfather   .  Esophageal cancer Paternal Uncle     Review of Systems  Constitutional: Negative.   HENT: Negative.   Eyes: Negative.   Respiratory: Negative.   Cardiovascular: Negative.   Gastrointestinal: Negative.   Endocrine: Negative.   Genitourinary:       Left breast pain   Musculoskeletal: Negative.   Skin: Negative.   Allergic/Immunologic: Negative.   Neurological: Negative.   Psychiatric/Behavioral: Negative.     Exam:   BP 104/68 (BP Location: Right Arm, Patient Position: Sitting, Cuff Size: Normal)   Pulse 72   Resp 14   Ht 5' 6.75" (1.695 m)   Wt 152 lb (68.9 kg)   LMP 03/24/2018   BMI 23.99 kg/m   Weight change: @WEIGHTCHANGE @  Height:   Height: 5' 6.75" (169.5 cm)  Ht Readings from Last 3 Encounters:  04/07/18 5' 6.75" (1.695 m)  09/19/17 5\' 7"  (1.702 m)  08/18/17 5' 6.5" (1.689 m)    General appearance: alert, cooperative and appears stated age Head: Normocephalic, without obvious abnormality, atraumatic Neck: no adenopathy, supple, symmetrical, trachea midline and thyroid normal to inspection and palpation Lungs: clear to auscultation bilaterally Cardiovascular: regular rate and rhythm Breasts: no masses, bilateral implants noted, tender in the left lateral breast Abdomen: soft, non-tender; non distended,  no masses,  no organomegaly Extremities: extremities normal, atraumatic, no cyanosis or edema Skin: Skin color, texture, turgor normal. No rashes or lesions Lymph nodes: Cervical, supraclavicular, and axillary nodes normal. No abnormal inguinal nodes palpated Neurologic: Grossly normal   Pelvic: External genitalia:  no lesions              Urethra:  normal appearing urethra with no masses, tenderness or lesions              Bartholins and Skenes: normal                 Vagina: normal appearing vagina with normal color and discharge, no lesions              Cervix: no lesions               Bimanual Exam:  Uterus:  normal size, contour, position, consistency, mobility, non-tender and retroverted              Adnexa: no mass, fullness, tenderness               Rectovaginal: Confirms               Anus:  normal sphincter tone, no lesions  Chaperone was present for exam.  A:  Well Woman with normal exam  Oligomenorrhea  Left breast pain/tenderness  Vit d def  P:   No pap this year  Left breast imaging  IFOB  Screening labs, prolactin, FSH, TSH, vit d  Discussed breast self exam  Discussed calcium and vit D intake  Menstrual calendar given, call if cycles become more irregular

## 2018-04-08 LAB — LIPID PANEL
Chol/HDL Ratio: 2 ratio (ref 0.0–4.4)
Cholesterol, Total: 131 mg/dL (ref 100–199)
HDL: 67 mg/dL (ref >39)
LDL CALC: 56 mg/dL (ref 0–99)
Triglycerides: 39 mg/dL (ref 0–149)
VLDL Cholesterol Cal: 8 mg/dL (ref 5–40)

## 2018-04-08 LAB — COMPREHENSIVE METABOLIC PANEL
ALBUMIN: 4.7 g/dL (ref 3.5–5.5)
ALK PHOS: 80 IU/L (ref 39–117)
ALT: 14 IU/L (ref 0–32)
AST: 14 IU/L (ref 0–40)
Albumin/Globulin Ratio: 2 (ref 1.2–2.2)
BILIRUBIN TOTAL: 0.3 mg/dL (ref 0.0–1.2)
BUN / CREAT RATIO: 15 (ref 9–23)
BUN: 9 mg/dL (ref 6–24)
CHLORIDE: 101 mmol/L (ref 96–106)
CO2: 22 mmol/L (ref 20–29)
Calcium: 9.4 mg/dL (ref 8.7–10.2)
Creatinine, Ser: 0.59 mg/dL (ref 0.57–1.00)
GFR calc non Af Amer: 109 mL/min/{1.73_m2} (ref >59)
GFR, EST AFRICAN AMERICAN: 126 mL/min/{1.73_m2} (ref >59)
GLOBULIN, TOTAL: 2.3 g/dL (ref 1.5–4.5)
Glucose: 80 mg/dL (ref 65–99)
Potassium: 4.7 mmol/L (ref 3.5–5.2)
SODIUM: 141 mmol/L (ref 134–144)
Total Protein: 7 g/dL (ref 6.0–8.5)

## 2018-04-08 LAB — CBC
HEMATOCRIT: 41.6 % (ref 34.0–46.6)
HEMOGLOBIN: 13.9 g/dL (ref 11.1–15.9)
MCH: 31.9 pg (ref 26.6–33.0)
MCHC: 33.4 g/dL (ref 31.5–35.7)
MCV: 95 fL (ref 79–97)
Platelets: 332 10*3/uL (ref 150–379)
RBC: 4.36 x10E6/uL (ref 3.77–5.28)
RDW: 13.9 % (ref 12.3–15.4)
WBC: 8.3 10*3/uL (ref 3.4–10.8)

## 2018-04-08 LAB — TSH: TSH: 2.08 u[IU]/mL (ref 0.450–4.500)

## 2018-04-08 LAB — VITAMIN D 25 HYDROXY (VIT D DEFICIENCY, FRACTURES): VIT D 25 HYDROXY: 43.2 ng/mL (ref 30.0–100.0)

## 2018-04-08 LAB — FOLLICLE STIMULATING HORMONE: FSH: 13.9 m[IU]/mL

## 2018-04-08 LAB — PROLACTIN: PROLACTIN: 15.5 ng/mL (ref 4.8–23.3)

## 2018-04-11 ENCOUNTER — Ambulatory Visit
Admission: RE | Admit: 2018-04-11 | Discharge: 2018-04-11 | Disposition: A | Discharge status: Home / Self Care | Payer: BLUE CROSS/BLUE SHIELD | Source: Ambulatory Visit | Attending: Obstetrics and Gynecology | Admitting: Obstetrics and Gynecology

## 2018-04-11 DIAGNOSIS — N644 Mastodynia: Secondary | ICD-10-CM

## 2018-04-19 LAB — FECAL OCCULT BLOOD, IMMUNOCHEMICAL: IMMUNOLOGICAL FECAL OCCULT BLOOD TEST: NEGATIVE

## 2018-07-10 ENCOUNTER — Other Ambulatory Visit: Payer: Self-pay | Admitting: Obstetrics & Gynecology

## 2018-07-10 DIAGNOSIS — Z1231 Encounter for screening mammogram for malignant neoplasm of breast: Secondary | ICD-10-CM

## 2018-08-09 ENCOUNTER — Other Ambulatory Visit: Payer: Self-pay

## 2018-08-09 DIAGNOSIS — I83893 Varicose veins of bilateral lower extremities with other complications: Secondary | ICD-10-CM

## 2018-08-23 ENCOUNTER — Ambulatory Visit
Admission: RE | Admit: 2018-08-23 | Discharge: 2018-08-23 | Disposition: A | Discharge status: Home / Self Care | Payer: BLUE CROSS/BLUE SHIELD | Source: Ambulatory Visit | Attending: Obstetrics & Gynecology | Admitting: Obstetrics & Gynecology

## 2018-08-23 DIAGNOSIS — Z1231 Encounter for screening mammogram for malignant neoplasm of breast: Secondary | ICD-10-CM

## 2018-09-07 ENCOUNTER — Encounter (HOSPITAL_COMMUNITY): Payer: BLUE CROSS/BLUE SHIELD

## 2018-09-07 ENCOUNTER — Encounter: Payer: BLUE CROSS/BLUE SHIELD | Admitting: Vascular Surgery

## 2019-01-09 DIAGNOSIS — E559 Vitamin D deficiency, unspecified: Secondary | ICD-10-CM | POA: Insufficient documentation

## 2019-05-08 NOTE — Progress Notes (Deleted)
48 y.o. G74P2002 Divorced White or Caucasian Not Hispanic or Latino female here for annual exam.      No LMP recorded.          Sexually active: {yes no:314532}  The current method of family planning is {contraception:315051}.    Exercising: {yes no:314532}  {types:19826} Smoker:  {YES NO:22349}  Health Maintenance: Pap:  12-23-16 WNL NEG HR HPV, 05-22-13 WNL NEG HR HPV  History of abnormal Pap:  no MMG:  08/23/2018 Birads 1 negative Colonoscopy:  Never BMD:   Never TDaP:  08-22-17 Gardasil: N/A   reports that she quit smoking about 20 months ago. Her smoking use included cigarettes. She has a 10.00 pack-year smoking history. She has never used smokeless tobacco. She reports that she does not drink alcohol or use drugs.  Past Medical History:  Diagnosis Date  . History of palpitations   . Kidney stones    h/o-no surgery    Past Surgical History:  Procedure Laterality Date  . AUGMENTATION MAMMAPLASTY Bilateral    2008  . BREAST ENHANCEMENT SURGERY  2009  . [place]   left foot  . [place], 2001   x2  . ESOPHAGEAL MANOMETRY N/A 09/19/2017   Procedure: ESOPHAGEAL MANOMETRY (EM);  Surgeon: Mauri Pole, MD;  [facility]: [facility];  Service: Endoscopy;  Laterality: N/A;  . LAPAROSCOPIC CHOLECYSTECTOMY  1995  . UMBILICAL HERNIA REPAIR N/A 11/01/2013   Procedure: HERNIA REPAIR UMBILICAL ADULT;  Surgeon: Adin Hector, MD;  Location: [facility];  Service: General;  Laterality: N/A;    Current Outpatient Medications  Medication Sig Dispense Refill  . ibuprofen (ADVIL,MOTRIN) 400 MG tablet Take 1-2 tablets (400-800 mg total) by mouth every 6 (six) hours as needed for fever, headache, mild pain, moderate pain or cramping. 40 tablet 1  . Multiple Vitamins-Minerals (MULTIVITAMIN PO) Take by mouth daily.     No current facility-administered medications for this visit.     Family History  Problem Relation Age of Onset  . Hepatitis Mother   . Liver  cancer Mother   . Cirrhosis Mother        liver transplant  . Kidney Stones Mother   . Lung cancer Father 49       brain metastasis, being treated at [facility]  . Stomach cancer Paternal Grandmother           . Alcoholism Paternal Grandfather   . Alcoholism Maternal Grandfather   . Esophageal cancer Paternal Uncle   . Breast cancer Neg Hx     Review of Systems  Exam:   There were no vitals taken for this visit.  Weight change: @WEIGHTCHANGE @ Height:      Ht Readings from Last 3 Encounters:  04/07/18 5' 6.75" (1.695 m)  09/19/17 5\' 7"  (1.702 m)  08/18/17 5' 6.5" (1.689 m)    General appearance: alert, cooperative and appears stated age Head: Normocephalic, without obvious abnormality, atraumatic Neck: no adenopathy, supple, symmetrical, trachea midline and thyroid {CHL AMB PHY EX THYROID NORM DEFAULT:[id number]::"normal to inspection and palpation"} Lungs: clear to auscultation bilaterally Cardiovascular: regular rate and rhythm Breasts: {Exam; breast:13139::"normal appearance, no masses or tenderness"} Abdomen: soft, non-tender; non distended,  no masses,  no organomegaly Extremities: extremities normal, atraumatic, no cyanosis or edema Skin: Skin color, texture, turgor normal. No rashes or lesions Lymph nodes: Cervical, supraclavicular, and axillary nodes normal. No abnormal inguinal nodes palpated Neurologic: Grossly normal   Pelvic: External genitalia:  no lesions  Urethra:  normal appearing urethra with no masses, tenderness or lesions              Bartholins and Skenes: normal                 Vagina: normal appearing vagina with normal color and discharge, no lesions              Cervix: {CHL AMB PHY EX CERVIX NORM DEFAULT:[phone]::"no lesions"}               Bimanual Exam:  Uterus:  {CHL AMB PHY EX UTERUS NORM [phone]::"normal size, contour, position, consistency, mobility, non-tender"}              Adnexa: {CHL AMB PHY EX ADNEXA NO MASS  DEFAULT:[phone]::"no mass, fullness, tenderness"}               Rectovaginal: Confirms               Anus:  normal sphincter tone, no lesions  Chaperone was present for exam.  A:  Well Woman with normal exam  P:

## 2019-05-09 ENCOUNTER — Encounter: Payer: Self-pay | Admitting: Obstetrics and Gynecology

## 2019-05-09 ENCOUNTER — Ambulatory Visit: Payer: BLUE CROSS/BLUE SHIELD | Admitting: Obstetrics and Gynecology

## 2019-10-09 NOTE — Progress Notes (Signed)
48 y.o. G47P2002 Divorced White or Caucasian Not Hispanic or Latino female here for annual exam.  Patient states that her Vit D3 got very low in the beginning of the year and she is wondering if she should take more Vit D3 than what is in a multi vitamin. She had a vit d of 20 in 1/20. Got her levels normal with high dose vit d, now just on a multivitamin.    LMP in 3/20, very sporadic prior to that. No vasomotor symptoms.  Not sexually active, no STD concern.   She c/o a few month h/o urinary urgency. Drinks a lot of water and coffee. Voids normal amounts.     No LMP recorded.       Sexually active: No.  The current method of family planning is none.    Exercising: No.  The patient has a physically strenuous job, but has no regular exercise apart from work.  Smoker:  Quit smoking cigarettes 2 years ago. Still smokes e-cigarettes, 1-2 x a day.   Health Maintenance: Pap:  12-23-16 WNL NEG HR HPV, 05-22-13 WNL NEG HR HPV  History of abnormal Pap:  no MMG:  10/31/2018 Birads 1 negative Colonoscopy:  Never BMD:   Never TDaP:  08-22-17 Gardasil: N/A   reports that she quit smoking about 2 years ago. Her smoking use included cigarettes. She has a 10.00 pack-year smoking history. She has never used smokeless tobacco. She reports that she does not drink alcohol or use drugs. 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 2 grown daughters. Older daughter is in [place], has some cognitive and mental handicaps.  Younger daughter is a Museum/gallery exhibitions officer at [employer].  She is in the process of adopting her grandson, almost 2. She also has a young granddaughter who lives with her Dad.  Past Medical History:  Diagnosis Date  . History of palpitations   . Kidney stones    h/o-no surgery    Past Surgical History:  Procedure Laterality Date  . AUGMENTATION MAMMAPLASTY Bilateral    2008  . BREAST ENHANCEMENT SURGERY  2009  . [place]   left foot  . [place], 2001   x2  . ESOPHAGEAL MANOMETRY N/A 09/19/2017   Procedure: ESOPHAGEAL MANOMETRY (EM);  Surgeon: Mauri Pole, MD;  Location: [facility];  Service: Endoscopy;  Laterality: N/A;  . LAPAROSCOPIC CHOLECYSTECTOMY  1995  . UMBILICAL HERNIA REPAIR N/A 11/01/2013   Procedure: HERNIA REPAIR UMBILICAL ADULT;  Surgeon: Adin Hector, MD;  Location: [facility];  Service: General;  Laterality: N/A;    Current Outpatient Medications  Medication Sig Dispense Refill  . b complex vitamins tablet Take by mouth.    Marland Kitchen ibuprofen (ADVIL,MOTRIN) 400 MG tablet Take 1-2 tablets (400-800 mg total) by mouth every 6 (six) hours as needed for fever, headache, mild pain, moderate pain or cramping. 40 tablet 1  . Multiple Vitamins-Minerals (MULTIVITAMIN PO) Take by mouth daily.    . Omega-3 Fatty Acids (FISH OIL) 1000 MG CAPS Take by mouth.    . Probiotic Product (PROBIOTIC-10 PO) Take by mouth.    . sucralfate (CARAFATE) 1 g tablet Take by mouth.     No current facility-administered medications for this visit.     Family History  Problem Relation Age of Onset  . Hepatitis Mother   . Liver cancer Mother   . Cirrhosis Mother        liver transplant  .  Kidney Stones Mother   . Lung cancer Father 34       brain metastasis, being treated at [facility]  . Stomach cancer Paternal Grandmother           . Alcoholism Paternal Grandfather   . Alcoholism Maternal Grandfather   . Esophageal cancer Paternal Uncle   . Breast cancer Neg Hx     Review of Systems  All other systems reviewed and are negative.   Exam:   BP 118/60   Pulse 73   Temp 98.1 F (36.7 C)   Ht 5' 6.5" (1.689 m)   Wt 166 lb (75.3 kg)   SpO2 97%   BMI 26.39 kg/m   Weight change: @WEIGHTCHANGE @ Height:   Height: 5' 6.5" (168.9 cm)  Ht Readings from Last 3 Encounters:  10/15/19 5' 6.5" (1.689 m)  04/07/18 5' 6.75" (1.695 m)  09/19/17 5\' 7"  (1.702 m)    General appearance: alert, cooperative and appears stated age Head:  Normocephalic, without obvious abnormality, atraumatic Neck: no adenopathy, supple, symmetrical, trachea midline and thyroid normal to inspection and palpation Lungs: clear to auscultation bilaterally Cardiovascular: regular rate and rhythm Breasts: normal appearance, no masses or tenderness Abdomen: soft, non-tender; non distended,  no masses,  no organomegaly Extremities: extremities normal, atraumatic, no cyanosis or edema Skin: Skin color, texture, turgor normal. No rashes or lesions Lymph nodes: Cervical, supraclavicular, and axillary nodes normal. No abnormal inguinal nodes palpated Neurologic: Grossly normal   Pelvic: External genitalia:  no lesions              Urethra:  normal appearing urethra with no masses, tenderness or lesions              Bartholins and Skenes: normal                 Vagina: normal appearing vagina with normal color and discharge, no lesions              Cervix: no lesions               Bimanual Exam:  Uterus:  normal size, contour, position, consistency, mobility, non-tender              Adnexa: no mass, fullness, tenderness               Rectovaginal: Confirms               Anus:  normal sphincter tone, no lesions  Chaperone was present for exam.  A:  Well Woman with normal exam  Smoking e-cigarettes, encouraged to quit.   P:   No pap this year  Mammogram due this month  Colon cancer screening next year  Discussed breast self exam  Discussed calcium and vit D intake  Call with any further bleeding  Labs UTD

## 2019-10-12 ENCOUNTER — Other Ambulatory Visit: Payer: Self-pay

## 2019-10-15 ENCOUNTER — Other Ambulatory Visit: Payer: Self-pay

## 2019-10-15 ENCOUNTER — Ambulatory Visit: Payer: Medicaid Other | Admitting: Obstetrics and Gynecology

## 2019-10-15 ENCOUNTER — Encounter: Payer: Self-pay | Admitting: Obstetrics and Gynecology

## 2019-10-15 VITALS — BP 118/60 | HR 73 | Temp 98.1°F | Ht 66.5 in | Wt 166.0 lb

## 2019-10-15 DIAGNOSIS — Z01419 Encounter for gynecological examination (general) (routine) without abnormal findings: Secondary | ICD-10-CM | POA: Diagnosis not present

## 2019-10-15 NOTE — Patient Instructions (Signed)
EXERCISE AND DIET:  We recommended that you start or continue a regular exercise program for good health. Regular exercise means any activity that makes your heart beat faster and makes you sweat.  We recommend exercising at least 30 minutes per day at least 3 days a week, preferably 4 or 5.  We also recommend a diet low in fat and sugar.  Inactivity, poor dietary choices and obesity can cause diabetes, heart attack, stroke, and kidney damage, among others.    ALCOHOL AND SMOKING:  Women should limit their alcohol intake to no more than 7 drinks/beers/glasses of wine (combined, not each!) per week. Moderation of alcohol intake to this level decreases your risk of breast cancer and liver damage. And of course, no recreational drugs are part of a healthy lifestyle.  And absolutely no smoking or even second hand smoke. Most people know smoking can cause heart and lung diseases, but did you know it also contributes to weakening of your bones? Aging of your skin?  Yellowing of your teeth and nails?  CALCIUM AND VITAMIN D:  Adequate intake of calcium and Vitamin D are recommended.  The recommendations for exact amounts of these supplements seem to change often, but generally speaking 1,200 mg of calcium (between diet and supplement) and 800 units of Vitamin D per day seems prudent. Certain women may benefit from higher intake of Vitamin D.  If you are among these women, your doctor will have told you during your visit.    PAP SMEARS:  Pap smears, to check for cervical cancer or precancers,  have traditionally been done yearly, although recent scientific advances have shown that most women can have pap smears less often.  However, every woman still should have a physical exam from her gynecologist every year. It will include a breast check, inspection of the vulva and vagina to check for abnormal growths or skin changes, a visual exam of the cervix, and then an exam to evaluate the size and shape of the uterus and  ovaries.  And after 49 years of age, a rectal exam is indicated to check for rectal cancers. We will also provide age appropriate advice regarding health maintenance, like when you should have certain vaccines, screening for sexually transmitted diseases, bone density testing, colonoscopy, mammograms, etc.   MAMMOGRAMS:  All women over 40 years old should have a yearly mammogram. Many facilities now offer a "3D" mammogram, which may cost around $50 extra out of pocket. If possible,  we recommend you accept the option to have the 3D mammogram performed.  It both reduces the number of women who will be called back for extra views which then turn out to be normal, and it is better than the routine mammogram at detecting truly abnormal areas.    COLON CANCER SCREENING: Now recommend starting at age 45. At this time colonoscopy is not covered for routine screening until 50. There are take home tests that can be done between 45-49.   COLONOSCOPY:  Colonoscopy to screen for colon cancer is recommended for all women at age 50.  We know, you hate the idea of the prep.  We agree, BUT, having colon cancer and not knowing it is worse!!  Colon cancer so often starts as a polyp that can be seen and removed at colonscopy, which can quite literally save your life!  And if your first colonoscopy is normal and you have no family history of colon cancer, most women don't have to have it again for   10 years.  Once every ten years, you can do something that may end up saving your life, right?  We will be happy to help you get it scheduled when you are ready.  Be sure to check your insurance coverage so you understand how much it will cost.  It may be covered as a preventative service at no cost, but you should check your particular policy.      Breast Self-Awareness Breast self-awareness means being familiar with how your breasts look and feel. It involves checking your breasts regularly and reporting any changes to your  health care provider. Practicing breast self-awareness is important. A change in your breasts can be a sign of a serious medical problem. Being familiar with how your breasts look and feel allows you to find any problems early, when treatment is more likely to be successful. All women should practice breast self-awareness, including women who have had breast implants. How to do a breast self-exam One way to learn what is normal for your breasts and whether your breasts are changing is to do a breast self-exam. To do a breast self-exam: Look for Changes  1. Remove all the clothing above your waist. 2. Stand in front of a mirror in a room with good lighting. 3. Put your hands on your hips. 4. Push your hands firmly downward. 5. Compare your breasts in the mirror. Look for differences between them (asymmetry), such as: ? Differences in shape. ? Differences in size. ? Puckers, dips, and bumps in one breast and not the other. 6. Look at each breast for changes in your skin, such as: ? Redness. ? Scaly areas. 7. Look for changes in your nipples, such as: ? Discharge. ? Bleeding. ? Dimpling. ? Redness. ? A change in position. Feel for Changes Carefully feel your breasts for lumps and changes. It is best to do this while lying on your back on the floor and again while sitting or standing in the shower or tub with soapy water on your skin. Feel each breast in the following way:  Place the arm on the side of the breast you are examining above your head.  Feel your breast with the other hand.  Start in the nipple area and make  inch (2 cm) overlapping circles to feel your breast. Use the pads of your three middle fingers to do this. Apply light pressure, then medium pressure, then firm pressure. The light pressure will allow you to feel the tissue closest to the skin. The medium pressure will allow you to feel the tissue that is a little deeper. The firm pressure will allow you to feel the tissue  close to the ribs.  Continue the overlapping circles, moving downward over the breast until you feel your ribs below your breast.  Move one finger-width toward the center of the body. Continue to use the  inch (2 cm) overlapping circles to feel your breast as you move slowly up toward your collarbone.  Continue the up and down exam using all three pressures until you reach your armpit.  Write Down What You Find  Write down what is normal for each breast and any changes that you find. Keep a written record with breast changes or normal findings for each breast. By writing this information down, you do not need to depend only on memory for size, tenderness, or location. Write down where you are in your menstrual cycle, if you are still menstruating. If you are having trouble noticing differences   in your breasts, do not get discouraged. With time you will become more familiar with the variations in your breasts and more comfortable with the exam. How often should I examine my breasts? Examine your breasts every month. If you are breastfeeding, the best time to examine your breasts is after a feeding or after using a breast pump. If you menstruate, the best time to examine your breasts is 5-7 days after your period is over. During your period, your breasts are lumpier, and it may be more difficult to notice changes. When should I see my health care provider? See your health care provider if you notice:  A change in shape or size of your breasts or nipples.  A change in the skin of your breast or nipples, such as a reddened or scaly area.  Unusual discharge from your nipples.  A lump or thick area that was not there before.  Pain in your breasts.  Anything that concerns you.  

## 2020-04-28 ENCOUNTER — Telehealth: Payer: Self-pay

## 2020-04-28 NOTE — Telephone Encounter (Signed)
Left detailed message, ok per dpr, Advised OV or MyChart visit needed, please return call to office to schedule.   Last AEX 10/15/19

## 2020-04-28 NOTE — Telephone Encounter (Signed)
Patient is calling in regards to a medication prescription. Patient states Dr. Talbert Nan had offered her a prescription for antidepressants at last annual (10/15/19). Patient declined at that time. Patient states she will now like to take the medication and if so does she need to have an appointment to see the doctor.

## 2020-04-29 ENCOUNTER — Other Ambulatory Visit: Payer: Self-pay | Admitting: Obstetrics & Gynecology

## 2020-04-29 DIAGNOSIS — Z1231 Encounter for screening mammogram for malignant neoplasm of breast: Secondary | ICD-10-CM

## 2020-04-29 NOTE — Telephone Encounter (Signed)
Spoke with patient. Patient would like to discuss medication options for anxiety. States her dad is sick and moving into hospice and she is trying to adopt her granddaughter. Denies SI/HI. Feels stressed and tired. MyChart visit r/s to 5/19 at 2pm with Dr. Talbert Nan. Patient is agreeable to date and time.   Routing to provider for final review. Patient is agreeable to disposition. Will close encounter.

## 2020-04-29 NOTE — Telephone Encounter (Signed)
Left message to call Johnny Gorter, RN at [facility] [phone].   

## 2020-04-29 NOTE — Telephone Encounter (Signed)
Patient returned call to schedule appointment. Patient is scheduled for a mychart visit on (05/14/20), first available appointmentp. Patient would like a sooner visit if possible. Need triage to assist.

## 2020-04-30 ENCOUNTER — Telehealth (INDEPENDENT_AMBULATORY_CARE_PROVIDER_SITE_OTHER): Payer: Medicaid Other | Admitting: Obstetrics and Gynecology

## 2020-04-30 ENCOUNTER — Other Ambulatory Visit: Payer: Self-pay

## 2020-04-30 ENCOUNTER — Encounter: Payer: Self-pay | Admitting: Obstetrics and Gynecology

## 2020-04-30 DIAGNOSIS — F439 Reaction to severe stress, unspecified: Secondary | ICD-10-CM | POA: Diagnosis not present

## 2020-04-30 DIAGNOSIS — F341 Dysthymic disorder: Secondary | ICD-10-CM | POA: Diagnosis not present

## 2020-04-30 DIAGNOSIS — Z0001 Encounter for general adult medical examination with abnormal findings: Secondary | ICD-10-CM | POA: Diagnosis not present

## 2020-04-30 MED ORDER — CITALOPRAM HYDROBROMIDE 20 MG PO TABS: ORAL_TABLET | ORAL | 1 refills | Status: DC

## 2020-04-30 NOTE — Patient Instructions (Signed)
[place] [place] counselor [phone]

## 2020-04-30 NOTE — Progress Notes (Signed)
Virtual Visit via Video Note  I connected with Shelly Blackwell on 04/30/20 at  2:00 PM EDT by a video enabled telemedicine application and verified that I am speaking with the correct person using two identifiers.  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   I discussed the limitations of evaluation and management by telemedicine and the availability of in person appointments. The patient expressed understanding and agreed to proceed.  GYNECOLOGY  VISIT   HPI: 49 y.o.   Divorced White or Caucasian Not Hispanic or Latino  female   [phone] with No LMP recorded.   here for a virtual visit to discuss medication for stress.  She is under so much stress. Dad with stage 4 lung cancer. She has a new job, broke up with her fiance, had to refinance her house. Mom may have early alzheimer's. Parents aren't together. She just got primary custody of her 24.50 year old [place]. Her daughter (his mom) is in [place].  Under lots of stress. Feeling a little depressed. Finding less joy. Sleeping fine. No SI or HI. She has gained a lot of weight with all of the stress.   She hasn't seen a counselor, she doesn't have a lot of support.    GYNECOLOGIC HISTORY: No LMP recorded.        OB History    Gravida  2   Para  2   Term  2   Preterm      AB      Living  2     SAB      TAB      Ectopic      Multiple      Live Births                 Patient Active Problem List   Diagnosis Date Noted  . Vitamin D deficiency 01/09/2019  . Dysphagia   . Laryngopharyngeal reflux 09/23/2014  . Tobacco abuse 10/01/2013  . Incisional hernia - periumbilical [id number]  . PVC's (premature ventricular contractions) 09/14/2013    Past Medical History:  Diagnosis Date  . History of palpitations   . Kidney stones    h/o-no surgery    Past Surgical History:  Procedure Laterality Date  . AUGMENTATION MAMMAPLASTY Bilateral    2008  . BREAST ENHANCEMENT SURGERY   2009  . [place]   left foot  . [place], 2001   x2  . ESOPHAGEAL MANOMETRY N/A 09/19/2017   Procedure: ESOPHAGEAL MANOMETRY (EM);  Surgeon: Mauri Pole, MD;  Location: [facility];  Service: Endoscopy;  Laterality: N/A;  . LAPAROSCOPIC CHOLECYSTECTOMY  1995  . UMBILICAL HERNIA REPAIR N/A 11/01/2013   Procedure: HERNIA REPAIR UMBILICAL ADULT;  Surgeon: Adin Hector, MD;  Location: [facility];  Service: General;  Laterality: N/A;    Current Outpatient Medications  Medication Sig Dispense Refill  . b complex vitamins tablet Take by mouth.    Marland Kitchen ibuprofen (ADVIL,MOTRIN) 400 MG tablet Take 1-2 tablets (400-800 mg total) by mouth every 6 (six) hours as needed for fever, headache, mild pain, moderate pain or cramping. 40 tablet 1  . Multiple Vitamins-Minerals (MULTIVITAMIN PO) Take by mouth daily.    . Omega-3 Fatty Acids (FISH OIL) 1000 MG CAPS Take by mouth.    . Probiotic Product (PROBIOTIC-10 PO) Take by mouth.    . sucralfate (CARAFATE) 1 g tablet Take by mouth.     No current facility-administered medications for this  visit.     ALLERGIES: Nickel  Family History  Problem Relation Age of Onset  . Hepatitis Mother   . Liver cancer Mother   . Cirrhosis Mother        liver transplant  . Kidney Stones Mother   . Lung cancer Father 53       brain metastasis, being [facility] at [facility]  . Stomach cancer Paternal Grandmother           . Alcoholism Paternal Grandfather   . Alcoholism Maternal Grandfather   . Esophageal cancer Paternal Uncle   . Breast cancer Neg Hx     Social History   Socioeconomic History  . Marital status: Divorced    Spouse name: n/a  . Number of children: 2  . Years of education: 27  . Highest education level: Not on file  Occupational History  . Occupation: Pharmacist, community: [employer]  Tobacco Use  . Smoking status: Former Smoker    Packs/day: 0.50    Years: 20.00    Pack years:  10.00    Types: Cigarettes    Quit date: 08/08/2017    Years since quitting: 2.7  . Smokeless tobacco: Never Used  . Tobacco comment: quit 07/2017, but uses e cig  Substance and Sexual Activity  . Alcohol use: No    Alcohol/week: 0.0 standard drinks  . Drug use: No  . Sexual activity: Yes    Partners: Male    Birth control/protection: None    Comment: Partner- vasectomy   Other Topics Concern  . Not on file  Social History Narrative   Lives with her youngest daughter. Her older daughter lives in [place], [place].   Social Determinants of Health   Financial Resource Strain:   . Difficulty of Paying Living Expenses:   Food Insecurity:   . Worried About Charity fundraiser in the Last Year:   . Arboriculturist in the Last Year:   Transportation Needs:   . Film/video editor (Medical):   Marland Kitchen Lack of Transportation (Non-Medical):   Physical Activity:   . Days of Exercise per Week:   . Minutes of Exercise per Session:   Stress:   . Feeling of Stress :   Social Connections:   . Frequency of Communication with Friends and Family:   . Frequency of Social Gatherings with Friends and Family:   . Attends Religious Services:   . Active Member of Clubs or Organizations:   . Attends Archivist Meetings:   Marland Kitchen Marital Status:   Intimate Partner Violence:   . Fear of Current or Ex-Partner:   . Emotionally Abused:   Marland Kitchen Physically Abused:   . Sexually Abused:     ROS  PHYSICAL EXAMINATION:    There were no vitals taken for this visit.    General appearance: alert, cooperative and appears stated age  ASSESSMENT Dysthymia, under lots of stress    PLAN Name of counselor given Start Celexa, f/u in one month   ~20 minutes in total patient care  Salvadore Dom, MD

## 2020-05-14 ENCOUNTER — Ambulatory Visit: Payer: BLUE CROSS/BLUE SHIELD

## 2020-05-14 ENCOUNTER — Telehealth: Payer: Medicaid Other | Admitting: Obstetrics and Gynecology

## 2020-07-01 ENCOUNTER — Other Ambulatory Visit: Payer: Self-pay

## 2020-07-01 NOTE — Telephone Encounter (Signed)
Medication refill request: Citalopram 20 mg  Last AEX:  10/15/19  Next AEX: 10/16/20  Last MMG (if hormonal medication request): NA Refill authorized: #30 with 1 RF

## 2020-07-02 NOTE — Telephone Encounter (Signed)
Please contact patient to see how she is doing on her medication and confirm the dosage she is taking.

## 2020-07-03 NOTE — Telephone Encounter (Signed)
Patient is calling to check on her refill request.

## 2020-07-03 NOTE — Telephone Encounter (Signed)
Tried calling patient, no answer. Left message for patient to call me back. 

## 2020-07-03 NOTE — Telephone Encounter (Signed)
Patient is calling to check status of medication refill.

## 2020-07-03 NOTE — Telephone Encounter (Signed)
Spoke with patient. Patient states that she is doing well with Citalopram and is taking 20mg . Advised patient that refill was pending and once it was done, I would contact patient.

## 2020-07-04 MED ORDER — CITALOPRAM HYDROBROMIDE 20 MG PO TABS: ORAL_TABLET | ORAL | 3 refills | Status: DC

## 2020-07-04 NOTE — Telephone Encounter (Signed)
Patient has been notified. Closing encounter.

## 2020-07-11 ENCOUNTER — Telehealth: Payer: Self-pay | Admitting: Obstetrics and Gynecology

## 2020-07-11 DIAGNOSIS — R232 Flushing: Secondary | ICD-10-CM

## 2020-07-11 NOTE — Telephone Encounter (Signed)
Spoke with pt. Pt states having no cycle/menses since March 2020. Pt states started on Celexa Rx on 07/04/20 and started having more consistent hot flashes. Pt states doesn't know if related to hormones or Celexa. Denies any other vasomotor sx. Pt also concerned if can still get pregnant. Pt currently has no contraception and is SA.    Pt advised to have OV to discuss and have possible labs for hormone testing. Pt offered multiple dates and times, pt declines due to working in [place]. Pt asking to have virtual visit. Pt advised ok to have for discussion, but possibly might need labwork to confirm menopause. Pt was last tested in 03/2018 and results were premenopausal. Pt agreeable. Pt scheduled for mychart visit on 8/4 at 11 am. Pt agreeable to date and time.   Pt asking to have labs drawn before so that results can be discussed at virtual appt. Pt advised will review with Dr Talbert Nan and return call with recommendations. Pt agreeable.    Routing to Dr Talbert Nan.  Next AEX 10/2020

## 2020-07-11 NOTE — Telephone Encounter (Signed)
Spoke with Dr Talbert Nan. Pt ok to have labs on Monday. FSH and [facility] orders placed.   Encounter closed.

## 2020-07-11 NOTE — Telephone Encounter (Signed)
Patient has not had a cycle since last year around March.

## 2020-07-14 ENCOUNTER — Other Ambulatory Visit: Payer: Self-pay

## 2020-07-16 ENCOUNTER — Telehealth: Payer: Medicaid Other | Admitting: Obstetrics and Gynecology

## 2020-10-06 DIAGNOSIS — M542 Cervicalgia: Secondary | ICD-10-CM | POA: Insufficient documentation

## 2020-10-13 NOTE — Progress Notes (Deleted)
49 y.o. [id number] Divorced White or Caucasian Not Hispanic or Latino female here for annual exam.      No LMP recorded.          Sexually active: {yes no:314532}  The current method of family planning is {contraception:315051}.    Exercising: {yes no:314532}  {types:19826} Smoker:  {YES NO:22349}  Health Maintenance: Pap:  12-23-16 WNL NEG HR HPV, 05-22-13 WNL NEG HR HPV  History of abnormal Pap:  no MMG:  08/23/18 density C Bi-rads 1 Neg  BMD:   Never  Colonoscopy: Never  TDaP:  08/22/17  Gardasil: N/A    reports that she quit smoking about 3 years ago. Her smoking use included cigarettes. She has a 10.00 pack-year smoking history. She has never used smokeless tobacco. She reports that she does not drink alcohol and does not use drugs.  Past Medical History:  Diagnosis Date  . History of palpitations   . Kidney stones    h/o-no surgery    Past Surgical History:  Procedure Laterality Date  . AUGMENTATION MAMMAPLASTY Bilateral    2008  . BREAST ENHANCEMENT SURGERY  2009  . [place]   left foot  . [place], 2001   x2  . ESOPHAGEAL MANOMETRY N/A 09/19/2017   Procedure: ESOPHAGEAL MANOMETRY (EM);  Surgeon: Mauri Pole, MD;  [facility];  Service: Endoscopy;  Laterality: N/A;  . LAPAROSCOPIC CHOLECYSTECTOMY  1995  . UMBILICAL HERNIA REPAIR N/A 11/01/2013   Procedure: HERNIA REPAIR UMBILICAL ADULT;  Surgeon: Adin Hector, MD;  Location: [facility];  Service: General;  Laterality: N/A;    Current Outpatient Medications  Medication Sig Dispense Refill  . b complex vitamins tablet Take by mouth.    . citalopram (CELEXA) 20 MG tablet Take one tablet (20 mg) by mouth daily. 30 tablet 3  . ibuprofen (ADVIL,MOTRIN) 400 MG tablet Take 1-2 tablets (400-800 mg total) by mouth every 6 (six) hours as needed for fever, headache, mild pain, moderate pain or cramping. 40 tablet 1  . Multiple Vitamins-Minerals (MULTIVITAMIN PO) Take by mouth daily.     . Omega-3 Fatty Acids (FISH OIL) 1000 MG CAPS Take by mouth.    . Probiotic Product (PROBIOTIC-10 PO) Take by mouth.    . sucralfate (CARAFATE) 1 g tablet Take by mouth.     No current facility-administered medications for this visit.    Family History  Problem Relation Age of Onset  . Hepatitis Mother   . Liver cancer Mother   . Cirrhosis Mother        liver transplant  . Kidney Stones Mother   . Lung cancer Father 31       brain metastasis, being treated at [facility]  . Stomach cancer Paternal Grandmother           . Alcoholism Paternal Grandfather   . Alcoholism Maternal Grandfather   . Esophageal cancer Paternal Uncle   . Breast cancer Neg Hx     Review of Systems  Exam:   There were no vitals taken for this visit.  Weight change: @WEIGHTCHANGE @ Height:      Ht Readings from Last 3 Encounters:  10/15/19 5' 6.5" (1.689 m)  04/07/18 5' 6.75" (1.695 m)  09/19/17 5\' 7"  (1.702 m)    General appearance: alert, cooperative and appears stated age Head: Normocephalic, without obvious abnormality, atraumatic Neck: no adenopathy, supple, symmetrical, trachea midline and thyroid {CHL AMB PHY EX THYROID NORM DEFAULT:[phone]::"normal to inspection and  palpation"} Lungs: clear to auscultation bilaterally Cardiovascular: regular rate and rhythm Breasts: {Exam; breast:13139::"normal appearance, no masses or tenderness"} Abdomen: soft, non-tender; non distended,  no masses,  no organomegaly Extremities: extremities normal, atraumatic, no cyanosis or edema Skin: Skin color, texture, turgor normal. No rashes or lesions Lymph nodes: Cervical, supraclavicular, and axillary nodes normal. No abnormal inguinal nodes palpated Neurologic: Grossly normal   Pelvic: External genitalia:  no lesions              Urethra:  normal appearing urethra with no masses, tenderness or lesions              Bartholins and Skenes: normal                 Vagina: normal appearing vagina with normal  color and discharge, no lesions              Cervix: {CHL AMB PHY EX CERVIX NORM [phone]::"no lesions"}               Bimanual Exam:  Uterus:  {CHL AMB PHY EX UTERUS NORM DEFAULT:[phone]::"normal size, contour, position, consistency, mobility, non-tender"}              Adnexa: {CHL AMB PHY EX ADNEXA NO MASS DEFAULT:[phone]::"no mass, fullness, tenderness"}               Rectovaginal: Confirms               Anus:  normal sphincter tone, no lesions  *** chaperoned for the exam.  A:  Well Woman with normal exam  P:

## 2020-10-15 NOTE — Progress Notes (Signed)
49 y.o. G34P2002 Divorced White or Caucasian Not Hispanic or Latino female here for annual exam.  Patient  Has not had a period since last march. She would like labs. She is having hot flashes.  She has gained 30 lbs in the last 6 months, she hasn't changed her diet. Not exercising.    Tolerable vasomotor symptoms.   Sexually active, same partner since July, using condoms.    Patient's last menstrual period was 02/11/2019.          Sexually active: Yes.    The current method of family planning is post menopausal status.    Exercising: No.  The patient does not participate in regular exercise at present. Smoker:  no  Health Maintenance: Pap:  12-23-16 WNL NEG HR HPV, 05-22-13 WNL NEG HR HPV History of abnormal Pap:  no MMG:  08/23/18 density C Bi-rads 1 neg  BMD:   Never  Colonoscopy: never  TDaP:  08/22/17  Gardasil: NA   reports that she quit smoking about 3 years ago. Her smoking use included cigarettes. She has a 10.00 pack-year smoking history. She has never used smokeless tobacco. She reports that she does not drink alcohol and does not use drugs. Working at [employer] job, Press photographer.  2 grown daughters. Older daughter is in [place], bipolar. Younger daughter is taking a break from [place], living with her boyfriend.  She adopted her grandson, almost 3. She also has a young granddaughter who lives with her Dad.  Past Medical History:  Diagnosis Date  . History of palpitations   . Kidney stones    h/o-no surgery    Past Surgical History:  Procedure Laterality Date  . AUGMENTATION MAMMAPLASTY Bilateral    2008  . BREAST ENHANCEMENT SURGERY  2009  . [place]   left foot  . [place], 2001   x2  . ESOPHAGEAL MANOMETRY N/A 09/19/2017   Procedure: ESOPHAGEAL MANOMETRY (EM);  Surgeon: Mauri Pole, MD;  Location: [facility];  Service: Endoscopy;  Laterality: N/A;  . LAPAROSCOPIC CHOLECYSTECTOMY  1995  . UMBILICAL HERNIA REPAIR N/A 11/01/2013    Procedure: HERNIA REPAIR UMBILICAL ADULT;  Surgeon: Adin Hector, MD;  Location: [facility];  Service: General;  Laterality: N/A;    Current Outpatient Medications  Medication Sig Dispense Refill  . citalopram (CELEXA) 20 MG tablet Take one tablet (20 mg) by mouth daily. 30 tablet 3  . ibuprofen (ADVIL,MOTRIN) 400 MG tablet Take 1-2 tablets (400-800 mg total) by mouth every 6 (six) hours as needed for fever, headache, mild pain, moderate pain or cramping. 40 tablet 1  . Multiple Vitamins-Minerals (MULTIVITAMIN PO) Take by mouth daily.    . Omega-3 Fatty Acids (FISH OIL) 1000 MG CAPS Take by mouth.    . Probiotic Product (PROBIOTIC-10 PO) Take by mouth.     No current facility-administered medications for this visit.    Family History  Problem Relation Age of Onset  . Hepatitis Mother   . Liver cancer Mother   . Cirrhosis Mother        liver transplant  . Kidney Stones Mother   . Lung cancer Father 53       brain metastasis, being treated at [facility]  . Stomach cancer Paternal Grandmother           . Alcoholism Paternal Grandfather   . Alcoholism Maternal Grandfather   . Esophageal cancer Paternal Uncle   . Breast cancer Neg Hx  Review of Systems  All other systems reviewed and are negative.   Exam:   BP 100/62   Pulse 83   Ht 5' 6.5" (1.689 m)   Wt 188 lb (85.3 kg)   LMP 02/11/2019   SpO2 97%   BMI 29.89 kg/m   Weight change: @WEIGHTCHANGE @ Height:   Height: 5' 6.5" (168.9 cm)  Ht Readings from Last 3 Encounters:  10/16/20 5' 6.5" (1.689 m)  10/15/19 5' 6.5" (1.689 m)  04/07/18 5' 6.75" (1.695 m)    General appearance: alert, cooperative and appears stated age Head: Normocephalic, without obvious abnormality, atraumatic Neck: no adenopathy, supple, symmetrical, trachea midline and thyroid normal to inspection and palpation Lungs: clear to auscultation bilaterally Cardiovascular: regular rate and rhythm Breasts: normal appearance, no masses or tenderness,  bilateral implants Abdomen: soft, non-tender; non distended,  no masses,  no organomegaly Extremities: extremities normal, atraumatic, no cyanosis or edema Skin: Skin color, texture, turgor normal. No rashes or lesions Lymph nodes: Cervical, supraclavicular, and axillary nodes normal. No abnormal inguinal nodes palpated Neurologic: Grossly normal   Pelvic: External genitalia:  no lesions              Urethra:  normal appearing urethra with no masses, tenderness or lesions              Bartholins and Skenes: normal                 Vagina: normal appearing vagina with normal color and discharge, no lesions              Cervix: no lesions               Bimanual Exam:  Uterus:  normal size, contour, position, consistency, mobility, non-tender              Adnexa: no mass, fullness, tenderness               Rectovaginal: Confirms               Anus:  normal sphincter tone, no lesions  Gae Dry chaperoned for the exam.  A:  Well Woman with normal exam  Weight gain  Vasomotor symptoms  H/O depression on Celexa  New partner  P:   Screening labs, TSH  No pap this year   STD testing  Mammogram scheduled  Colonoscopy referral placed  Discussed breast self exam  Discussed calcium and vit D intake   Continue Celexa

## 2020-10-16 ENCOUNTER — Encounter: Payer: Self-pay | Admitting: Obstetrics and Gynecology

## 2020-10-16 ENCOUNTER — Other Ambulatory Visit: Payer: Self-pay

## 2020-10-16 ENCOUNTER — Ambulatory Visit: Payer: Medicaid Other | Admitting: Obstetrics and Gynecology

## 2020-10-16 ENCOUNTER — Ambulatory Visit (INDEPENDENT_AMBULATORY_CARE_PROVIDER_SITE_OTHER): Payer: PRIVATE HEALTH INSURANCE | Admitting: Obstetrics and Gynecology

## 2020-10-16 VITALS — BP 100/62 | HR 83 | Ht 66.5 in | Wt 188.0 lb

## 2020-10-16 DIAGNOSIS — Z0000 Encounter for general adult medical examination without abnormal findings: Secondary | ICD-10-CM

## 2020-10-16 DIAGNOSIS — Z780 Asymptomatic menopausal state: Secondary | ICD-10-CM

## 2020-10-16 DIAGNOSIS — Z1211 Encounter for screening for malignant neoplasm of colon: Secondary | ICD-10-CM | POA: Diagnosis not present

## 2020-10-16 DIAGNOSIS — R635 Abnormal weight gain: Secondary | ICD-10-CM

## 2020-10-16 DIAGNOSIS — Z01419 Encounter for gynecological examination (general) (routine) without abnormal findings: Secondary | ICD-10-CM | POA: Diagnosis not present

## 2020-10-16 DIAGNOSIS — Z113 Encounter for screening for infections with a predominantly sexual mode of transmission: Secondary | ICD-10-CM | POA: Diagnosis not present

## 2020-10-16 DIAGNOSIS — E559 Vitamin D deficiency, unspecified: Secondary | ICD-10-CM

## 2020-10-16 MED ORDER — CITALOPRAM HYDROBROMIDE 20 MG PO TABS: ORAL_TABLET | ORAL | 3 refills | Status: DC

## 2020-10-16 NOTE — Patient Instructions (Addendum)
[place] for hot flashes   Menopause Menopause is the normal time of life when menstrual periods stop completely. It is usually confirmed by 12 months without a menstrual period. The transition to menopause (perimenopause) most often happens between the ages of 46 and 48. During perimenopause, hormone levels change in your body, which can cause symptoms and affect your health. Menopause may increase your risk for:  Loss of bone (osteoporosis), which causes bone breaks (fractures).  Depression.  Hardening and narrowing of the arteries (atherosclerosis), which can cause heart attacks and strokes. What are the causes? This condition is usually caused by a natural change in hormone levels that happens as you get older. The condition may also be caused by surgery to remove both ovaries (bilateral oophorectomy). What increases the risk? This condition is more likely to start at an earlier age if you have certain medical conditions or treatments, including:  A tumor of the pituitary gland in the brain.  A disease that affects the ovaries and hormone production.  Radiation treatment for cancer.  Certain cancer treatments, such as chemotherapy or hormone (anti-estrogen) therapy.  Heavy smoking and excessive alcohol use.  Family history of early menopause. This condition is also more likely to develop earlier in women who are very thin. What are the signs or symptoms? Symptoms of this condition include:  Hot flashes.  Irregular menstrual periods.  Night sweats.  Changes in feelings about sex. This could be a decrease in sex drive or an increased comfort around your sexuality.  Vaginal dryness and thinning of the vaginal walls. This may cause painful intercourse.  Dryness of the skin and development of wrinkles.  Headaches.  Problems sleeping (insomnia).  Mood swings or irritability.  Memory problems.  Weight gain.  Hair growth on the face and chest.  Bladder infections or  problems with urinating. How is this diagnosed? This condition is diagnosed based on your medical history, a physical exam, your age, your menstrual history, and your symptoms. Hormone tests may also be done. How is this treated? In some cases, no treatment is needed. You and your health care provider should make a decision together about whether treatment is necessary. Treatment will be based on your individual condition and preferences. Treatment for this condition focuses on managing symptoms. Treatment may include:  Menopausal hormone therapy (MHT).  Medicines to treat specific symptoms or complications.  Acupuncture.  Vitamin or herbal supplements. Before starting treatment, make sure to let your health care provider know if you have a personal or family history of:  Heart disease.  Breast cancer.  Blood clots.  Diabetes.  Osteoporosis. Follow these instructions at home: Lifestyle  Do not use any products that contain nicotine or tobacco, such as cigarettes and e-cigarettes. If you need help quitting, ask your health care provider.  Get at least 30 minutes of physical activity on 5 or more days each week.  Avoid alcoholic and caffeinated beverages, as well as spicy foods. This may help prevent hot flashes.  Get 7-8 hours of sleep each night.  If you have hot flashes, try: ? Dressing in layers. ? Avoiding things that may trigger hot flashes, such as spicy food, warm places, or stress. ? Taking slow, deep breaths when a hot flash starts. ? Keeping a fan in your home and office.  Find ways to manage stress, such as deep breathing, meditation, or journaling.  Consider going to group therapy with other women who are having menopause symptoms. Ask your health care provider about  recommended group therapy meetings. Eating and drinking  Eat a healthy, balanced diet that contains whole grains, lean protein, low-fat dairy, and plenty of fruits and vegetables.  Your health  care provider may recommend adding more soy to your diet. Foods that contain soy include tofu, tempeh, and soy milk.  Eat plenty of foods that contain calcium and vitamin D for bone health. Items that are rich in calcium include low-fat milk, yogurt, beans, almonds, sardines, broccoli, and kale. Medicines  Take over-the-counter and prescription medicines only as told by your health care provider.  Talk with your health care provider before starting any herbal supplements. If prescribed, take vitamins and supplements as told by your health care provider. These may include: ? Calcium. Women age 64 and older should get 1,200 mg (milligrams) of calcium every day. ? Vitamin D. Women need 600-800 International Units of vitamin D each day. ? Vitamins B12 and B6. Aim for 50 micrograms of B12 and 1.5 mg of B6 each day. General instructions  Keep track of your menstrual periods, including: ? When they occur. ? How heavy they are and how long they last. ? How much time passes between periods.  Keep track of your symptoms, noting when they start, how often you have them, and how long they last.  Use vaginal lubricants or moisturizers to help with vaginal dryness and improve comfort during sex.  Keep all follow-up visits as told by your health care provider. This is important. This includes any group therapy or counseling. Contact a health care provider if:  You are still having menstrual periods after age 59.  You have pain during sex.  You have not had a period for 12 months and you develop vaginal bleeding. Get help right away if:  You have: ? Severe depression. ? Excessive vaginal bleeding. ? Pain when you urinate. ? A fast or irregular heart beat (palpitations). ? Severe headaches. ? Abdomen (abdominal) pain or severe indigestion.  You fell and you think you have a broken bone.  You develop leg or chest pain.  You develop vision problems.  You feel a lump in your  breast. Summary  Menopause is the normal time of life when menstrual periods stop completely. It is usually confirmed by 12 months without a menstrual period.  The transition to menopause (perimenopause) most often happens between the ages of 54 and 75.  Symptoms can be managed through medicines, lifestyle changes, and complementary therapies such as acupuncture.  Eat a balanced diet that is rich in nutrients to promote bone health and heart health and to manage symptoms during menopause. This information is not intended to replace advice given to you by your health care provider. Make sure you discuss any questions you have with your health care provider. Document Revised: 11/11/2017 Document Reviewed: 01/01/2017 Elsevier Patient Education  [place] DIET:  We recommended that you start or continue a regular exercise program for good health. Regular exercise means any activity that makes your heart beat faster and makes you sweat.  We recommend exercising at least 30 minutes per day at least 3 days a week, preferably 4 or 5.  We also recommend a diet low in fat and sugar.  Inactivity, poor dietary choices and obesity can cause diabetes, heart attack, stroke, and kidney damage, among others.    ALCOHOL AND SMOKING:  Women should limit their alcohol intake to no more than 7 drinks/beers/glasses of wine (combined, not each!) per week. Moderation of  alcohol intake to this level decreases your risk of breast cancer and liver damage. And of course, no recreational drugs are part of a healthy lifestyle.  And absolutely no smoking or even second hand smoke. Most people know smoking can cause heart and lung diseases, but did you know it also contributes to weakening of your bones? Aging of your skin?  Yellowing of your teeth and nails?  CALCIUM AND VITAMIN D:  Adequate intake of calcium and Vitamin D are recommended.  The recommendations for exact amounts of these supplements seem  to change often, but generally speaking 1,200 mg of calcium (between diet and supplement) and 800 units of Vitamin D per day seems prudent. Certain women may benefit from higher intake of Vitamin D.  If you are among these women, your doctor will have told you during your visit.    PAP SMEARS:  Pap smears, to check for cervical cancer or precancers,  have traditionally been done yearly, although recent scientific advances have shown that most women can have pap smears less often.  However, every woman still should have a physical exam from her gynecologist every year. It will include a breast check, inspection of the vulva and vagina to check for abnormal growths or skin changes, a visual exam of the cervix, and then an exam to evaluate the size and shape of the uterus and ovaries.  And after 50 years of age, a rectal exam is indicated to check for rectal cancers. We will also provide age appropriate advice regarding health maintenance, like when you should have certain vaccines, screening for sexually transmitted diseases, bone density testing, colonoscopy, mammograms, etc.   MAMMOGRAMS:  All women over 73 years old should have a yearly mammogram. Many facilities now offer a "3D" mammogram, which may cost around $50 extra out of pocket. If possible,  we recommend you accept the option to have the 3D mammogram performed.  It both reduces the number of women who will be called back for extra views which then turn out to be normal, and it is better than the routine mammogram at detecting truly abnormal areas.    COLON CANCER SCREENING: Now recommend starting at age 15. At this time colonoscopy is not covered for routine screening until 50. There are take home tests that can be done between 45-49.   COLONOSCOPY:  Colonoscopy to screen for colon cancer is recommended for all women at age 62.  We know, you hate the idea of the prep.  We agree, BUT, having colon cancer and not knowing it is worse!!  Colon cancer  so often starts as a polyp that can be seen and removed at colonscopy, which can quite literally save your life!  And if your first colonoscopy is normal and you have no family history of colon cancer, most women don't have to have it again for 10 years.  Once every ten years, you can do something that may end up saving your life, right?  We will be happy to help you get it scheduled when you are ready.  Be sure to check your insurance coverage so you understand how much it will cost.  It may be covered as a preventative service at no cost, but you should check your particular policy.      Breast Self-Awareness Breast self-awareness means being familiar with how your breasts look and feel. It involves checking your breasts regularly and reporting any changes to your health care provider. Practicing breast self-awareness is important. A  change in your breasts can be a sign of a serious medical problem. Being familiar with how your breasts look and feel allows you to find any problems early, when treatment is more likely to be successful. All women should practice breast self-awareness, including women who have had breast implants. How to do a breast self-exam One way to learn what is normal for your breasts and whether your breasts are changing is to do a breast self-exam. To do a breast self-exam: Look for Changes  1. Remove all the clothing above your waist. 2. Stand in front of a mirror in a room with good lighting. 3. Put your hands on your hips. 4. Push your hands firmly downward. 5. Compare your breasts in the mirror. Look for differences between them (asymmetry), such as: ? Differences in shape. ? Differences in size. ? Puckers, dips, and bumps in one breast and not the other. 6. Look at each breast for changes in your skin, such as: ? Redness. ? Scaly areas. 7. Look for changes in your nipples, such as: ? Discharge. ? Bleeding. ? Dimpling. ? Redness. ? A change in position. Feel  for Changes Carefully feel your breasts for lumps and changes. It is best to do this while lying on your back on the floor and again while sitting or standing in the shower or tub with soapy water on your skin. Feel each breast in the following way:  Place the arm on the side of the breast you are examining above your head.  Feel your breast with the other hand.  Start in the nipple area and make  inch (2 cm) overlapping circles to feel your breast. Use the pads of your three middle fingers to do this. Apply light pressure, then medium pressure, then firm pressure. The light pressure will allow you to feel the tissue closest to the skin. The medium pressure will allow you to feel the tissue that is a little deeper. The firm pressure will allow you to feel the tissue close to the ribs.  Continue the overlapping circles, moving downward over the breast until you feel your ribs below your breast.  Move one finger-width toward the center of the body. Continue to use the  inch (2 cm) overlapping circles to feel your breast as you move slowly up toward your collarbone.  Continue the up and down exam using all three pressures until you reach your armpit.  Write Down What You Find  Write down what is normal for each breast and any changes that you find. Keep a written record with breast changes or normal findings for each breast. By writing this information down, you do not need to depend only on memory for size, tenderness, or location. Write down where you are in your menstrual cycle, if you are still menstruating. If you are having trouble noticing differences in your breasts, do not get discouraged. With time you will become more familiar with the variations in your breasts and more comfortable with the exam. How often should I examine my breasts? Examine your breasts every month. If you are breastfeeding, the best time to examine your breasts is after a feeding or after using a breast pump. If you  menstruate, the best time to examine your breasts is 5-7 days after your period is over. During your period, your breasts are lumpier, and it may be more difficult to notice changes. When should I see my health care provider? See your health care provider if you notice:  A change in shape or size of your breasts or nipples.  A change in the skin of your breast or nipples, such as a reddened or scaly area.  Unusual discharge from your nipples.  A lump or thick area that was not there before.  Pain in your breasts.  Anything that concerns you.

## 2020-10-17 LAB — CBC
Hematocrit: 41.6 % (ref 34.0–46.6)
Hemoglobin: 13.9 g/dL (ref 11.1–15.9)
MCH: 31 pg (ref 26.6–33.0)
MCHC: 33.4 g/dL (ref 31.5–35.7)
MCV: 93 fL (ref 79–97)
Platelets: 267 10*3/uL (ref 150–450)
RBC: 4.48 x10E6/uL (ref 3.77–5.28)
RDW: 12.6 % (ref 11.7–15.4)
WBC: 6.4 10*3/uL (ref 3.4–10.8)

## 2020-10-17 LAB — COMPREHENSIVE METABOLIC PANEL
ALT: 40 IU/L — ABNORMAL HIGH (ref 0–32)
AST: 27 IU/L (ref 0–40)
Albumin/Globulin Ratio: 2.3 — ABNORMAL HIGH (ref 1.2–2.2)
Albumin: 4.9 g/dL — ABNORMAL HIGH (ref 3.8–4.8)
Alkaline Phosphatase: 113 IU/L (ref 44–121)
BUN/Creatinine Ratio: 14 (ref 9–23)
BUN: 9 mg/dL (ref 6–24)
Bilirubin Total: 0.3 mg/dL (ref 0.0–1.2)
CO2: 24 mmol/L (ref 20–29)
Calcium: 9.5 mg/dL (ref 8.7–10.2)
Chloride: 102 mmol/L (ref 96–106)
Creatinine, Ser: 0.65 mg/dL (ref 0.57–1.00)
GFR calc Af Amer: 120 mL/min/{1.73_m2} (ref >59)
GFR calc non Af Amer: 104 mL/min/{1.73_m2} (ref >59)
Globulin, Total: 2.1 g/dL (ref 1.5–4.5)
Glucose: 89 mg/dL (ref 65–99)
Potassium: 4.9 mmol/L (ref 3.5–5.2)
Sodium: 142 mmol/L (ref 134–144)
Total Protein: 7 g/dL (ref 6.0–8.5)

## 2020-10-17 LAB — TSH: TSH: 1.18 u[IU]/mL (ref 0.450–4.500)

## 2020-10-17 LAB — LIPID PANEL
Chol/HDL Ratio: 2.2 ratio (ref 0.0–4.4)
Cholesterol, Total: 134 mg/dL (ref 100–199)
HDL: 60 mg/dL (ref >39)
LDL Chol Calc (NIH): 63 mg/dL (ref 0–99)
Triglycerides: 49 mg/dL (ref 0–149)
VLDL Cholesterol Cal: 11 mg/dL (ref 5–40)

## 2020-10-17 LAB — GC/CHLAMYDIA PROBE AMP
Chlamydia trachomatis, NAA: NEGATIVE
Neisseria Gonorrhoeae by PCR: NEGATIVE

## 2020-10-17 LAB — FOLLICLE STIMULATING HORMONE: FSH: 79 m[IU]/mL

## 2020-10-17 LAB — HIV ANTIBODY (ROUTINE TESTING W REFLEX): HIV Screen 4th Generation wRfx: NONREACTIVE

## 2020-10-17 LAB — RPR: RPR Ser Ql: NONREACTIVE

## 2020-10-17 LAB — VITAMIN D 25 HYDROXY (VIT D DEFICIENCY, FRACTURES): Vit D, 25-Hydroxy: 41.3 ng/mL (ref 30.0–100.0)

## 2020-10-21 ENCOUNTER — Encounter: Payer: Self-pay | Admitting: Obstetrics and Gynecology

## 2020-10-22 ENCOUNTER — Telehealth: Payer: Self-pay

## 2020-10-22 DIAGNOSIS — Z0000 Encounter for general adult medical examination without abnormal findings: Secondary | ICD-10-CM

## 2020-10-22 NOTE — Telephone Encounter (Signed)
Pt sent following mychart message:  [facility], [facility] Hi Dr. Talbert Nan! I hope this finds you well. I just saw ALT and the (other two) on same test came back slightly raised. Anything I need to know or do? or is this normal?  Thank you !  Shelly Blackwell

## 2020-10-22 NOTE — Telephone Encounter (Signed)
Left message for pt to return call to triage RN.   Shelly Dom, MD  10/22/2020 1:05 PM EST Back to Top    Please let the patient know that one of her LFT's is mildly elevated Her FSH is in a menopausal range. The rest of her lab work is normal. She doesn't drink ETOH, she should have a repeat liver panel in one month.

## 2020-10-22 NOTE — Telephone Encounter (Signed)
Routing to Dr Talbert Nan. Please advise on results from 10/16/20.

## 2020-10-22 NOTE — Telephone Encounter (Signed)
Please see result note sent earlier today

## 2020-10-23 NOTE — Telephone Encounter (Signed)
Spoke with pt. Pt given results and recommendations per Dr Talbert Nan. Pt verbalized understanding. Pt agreeable to repeat lab in 1 month. Pt scheduled for 12/16 at 845 am.  Encounter closed  Orders placed.

## 2020-10-23 NOTE — Telephone Encounter (Signed)
Patient is returning call.  °

## 2020-10-23 NOTE — Telephone Encounter (Signed)
Returned call to patient. Left message to call Estill Bamberg, CMA.

## 2020-10-31 ENCOUNTER — Ambulatory Visit: Payer: Medicaid Other

## 2020-11-13 ENCOUNTER — Ambulatory Visit
Admission: RE | Admit: 2020-11-13 | Discharge: 2020-11-13 | Disposition: A | Discharge status: Home / Self Care | Payer: Medicaid Other | Source: Ambulatory Visit | Attending: Obstetrics & Gynecology | Admitting: Obstetrics & Gynecology

## 2020-11-13 ENCOUNTER — Other Ambulatory Visit: Payer: Self-pay

## 2020-11-13 DIAGNOSIS — Z1231 Encounter for screening mammogram for malignant neoplasm of breast: Secondary | ICD-10-CM

## 2020-11-27 ENCOUNTER — Other Ambulatory Visit: Payer: Self-pay

## 2020-11-27 ENCOUNTER — Other Ambulatory Visit: Payer: PRIVATE HEALTH INSURANCE

## 2020-11-27 DIAGNOSIS — Z0000 Encounter for general adult medical examination without abnormal findings: Secondary | ICD-10-CM

## 2020-11-28 LAB — HEPATIC FUNCTION PANEL
ALT: 41 IU/L — ABNORMAL HIGH (ref 0–32)
AST: 28 IU/L (ref 0–40)
Albumin: 4.4 g/dL (ref 3.8–4.8)
Alkaline Phosphatase: 133 IU/L — ABNORMAL HIGH (ref 44–121)
Bilirubin Total: 0.2 mg/dL (ref 0.0–1.2)
Bilirubin, Direct: 0.11 mg/dL (ref 0.00–0.40)
Total Protein: 7.2 g/dL (ref 6.0–8.5)

## 2020-12-19 ENCOUNTER — Encounter: Payer: Self-pay | Admitting: Obstetrics and Gynecology

## 2021-01-05 ENCOUNTER — Other Ambulatory Visit (HOSPITAL_COMMUNITY): Payer: Self-pay | Admitting: Physician Assistant

## 2021-01-05 ENCOUNTER — Other Ambulatory Visit: Payer: Self-pay | Admitting: Physician Assistant

## 2021-01-05 DIAGNOSIS — M542 Cervicalgia: Secondary | ICD-10-CM

## 2021-01-12 ENCOUNTER — Ambulatory Visit: Payer: Medicaid Other | Admitting: Internal Medicine

## 2021-01-13 ENCOUNTER — Ambulatory Visit (HOSPITAL_COMMUNITY): Payer: PRIVATE HEALTH INSURANCE

## 2021-01-13 ENCOUNTER — Encounter (HOSPITAL_COMMUNITY): Payer: Self-pay

## 2021-01-21 ENCOUNTER — Other Ambulatory Visit (HOSPITAL_COMMUNITY): Payer: PRIVATE HEALTH INSURANCE

## 2021-01-22 ENCOUNTER — Other Ambulatory Visit: Payer: Self-pay | Admitting: Nurse Practitioner

## 2021-01-22 DIAGNOSIS — R1011 Right upper quadrant pain: Secondary | ICD-10-CM

## 2021-01-22 DIAGNOSIS — R748 Abnormal levels of other serum enzymes: Secondary | ICD-10-CM

## 2021-02-05 ENCOUNTER — Other Ambulatory Visit: Payer: Self-pay

## 2021-02-05 ENCOUNTER — Ambulatory Visit
Admission: RE | Admit: 2021-02-05 | Discharge: 2021-02-05 | Disposition: A | Discharge status: Home / Self Care | Payer: PRIVATE HEALTH INSURANCE | Source: Ambulatory Visit | Attending: Nurse Practitioner | Admitting: Nurse Practitioner

## 2021-02-05 DIAGNOSIS — R748 Abnormal levels of other serum enzymes: Secondary | ICD-10-CM

## 2021-02-05 DIAGNOSIS — R1011 Right upper quadrant pain: Secondary | ICD-10-CM

## 2021-03-23 ENCOUNTER — Encounter: Payer: Self-pay | Admitting: Physician Assistant

## 2021-03-23 ENCOUNTER — Ambulatory Visit (INDEPENDENT_AMBULATORY_CARE_PROVIDER_SITE_OTHER): Payer: PRIVATE HEALTH INSURANCE | Admitting: Physician Assistant

## 2021-03-23 VITALS — BP 124/76 | HR 85 | Ht 67.0 in | Wt 201.0 lb

## 2021-03-23 DIAGNOSIS — Z1211 Encounter for screening for malignant neoplasm of colon: Secondary | ICD-10-CM | POA: Diagnosis not present

## 2021-03-23 DIAGNOSIS — R6881 Early satiety: Secondary | ICD-10-CM

## 2021-03-23 DIAGNOSIS — Z1212 Encounter for screening for malignant neoplasm of rectum: Secondary | ICD-10-CM | POA: Diagnosis not present

## 2021-03-23 DIAGNOSIS — R140 Abdominal distension (gaseous): Secondary | ICD-10-CM | POA: Diagnosis not present

## 2021-03-23 MED ORDER — NA SULFATE-K SULFATE-MG SULF 17.5-3.13-1.6 GM/177ML PO SOLN: 1.0000 | Freq: Once | ORAL | 0 refills | Status: AC

## 2021-03-23 NOTE — Progress Notes (Signed)
Chief Complaint: Discuss EGD and colonoscopy  HPI:    Shelly Blackwell is a 50 year old Caucasian female, known to Dr. Fuller Plan, with a past medical history as listed below, who presents to clinic today to discuss an EGD and colonoscopy.    08/18/2017 patient seen in clinic by Nicoletta Ba, PA-C to discuss dysphagia as well as throat and chest tightness.  Apparently had an EGD done in 2016 at [facility] with no evidence of esophagitis or stricture and it was commented that she had a spastic esophagus with patent LES.  Symptoms were suspected secondary to underlying motility disorder.  She was scheduled for barium swallow with tablet and start on Pepcid 40 once a day.    08/30/2017 barium swallow showed nonspecific esophageal motility disorder.  Description primary peristaltic stripping wave and resultant stasis of barium in the esophagus.  Was recommended she have an esophageal manometry.    09/19/2017 esophageal manometry with no significant esophageal peristaltic abnormality noted and normal relaxation of the EG junction.  She was continued on Pepcid 40 mg daily and also added Levsin once daily midday to see if that helped.    Today, the patient tells me that shortly after being seen in our clinic 2 years ago she started a probiotic which worked great and took care of all of her problems including heartburn for 2 years until the past 3 months.  Over the past 3 months explains that everything has gotten worse.  She feels "bloated all the time", and very full after eating.  She has gained some weight and feels nauseous when her stomach is empty.  She just does not feel right.  Tells me she does have fairly normal stools which are solid and daily and feel complete.  She restarted Omeprazole 20 mg 2 weeks ago after being seen in the urgent care.  She was tested at that time for H. pylori with a blood test per her and this was negative (we do not have results).    Denies fever, chills, blood in her stool or weight  loss.  Past Medical History:  Diagnosis Date  . History of palpitations   . Kidney stones    h/o-no surgery    Past Surgical History:  Procedure Laterality Date  . AUGMENTATION MAMMAPLASTY Bilateral    2008  . BREAST ENHANCEMENT SURGERY  2009  . [place]   left foot  . [place], 2001   x2  . ESOPHAGEAL MANOMETRY N/A 09/19/2017   Procedure: ESOPHAGEAL MANOMETRY (EM);  Surgeon: Mauri Pole, MD;  Location: [facility];  Service: Endoscopy;  Laterality: N/A;  . LAPAROSCOPIC CHOLECYSTECTOMY  1995  . UMBILICAL HERNIA REPAIR N/A 11/01/2013   Procedure: HERNIA REPAIR UMBILICAL ADULT;  Surgeon: Adin Hector, MD;  Location: [facility];  Service: General;  Laterality: N/A;    Current Outpatient Medications  Medication Sig Dispense Refill  . citalopram (CELEXA) 20 MG tablet Take one tablet (20 mg) by mouth daily. 90 tablet 3  . ibuprofen (ADVIL,MOTRIN) 400 MG tablet Take 1-2 tablets (400-800 mg total) by mouth every 6 (six) hours as needed for fever, headache, mild pain, moderate pain or cramping. 40 tablet 1  . Multiple Vitamins-Minerals (MULTIVITAMIN PO) Take by mouth daily.    . Omega-3 Fatty Acids (FISH OIL) 1000 MG CAPS Take by mouth.    . Probiotic Product (PROBIOTIC-10 PO) Take by mouth.     No current facility-administered medications for this visit.  Allergies as of 03/23/2021 - Review Complete 10/16/2020  Allergen Reaction Noted  . Nickel Rash 05/22/2013    Family History  Problem Relation Age of Onset  . Hepatitis Mother   . Liver cancer Mother   . Cirrhosis Mother        liver transplant  . Kidney Stones Mother   . Lung cancer Father 80       brain metastasis, being [facility]  . Stomach cancer Paternal Grandmother           . Alcoholism Paternal Grandfather   . Alcoholism Maternal Grandfather   . Esophageal cancer Paternal Uncle   . Breast cancer Neg Hx     Social History   Socioeconomic History  .  Marital status: Divorced    Spouse name: n/a  . Number of children: 2  . Years of education: 73  . Highest education level: Not on file  Occupational History  . Occupation: Pharmacist, community: [employer]  Tobacco Use  . Smoking status: Former Smoker    Packs/day: 0.50    Years: 20.00    Pack years: 10.00    Types: Cigarettes    Quit date: 08/08/2017    Years since quitting: 3.6  . Smokeless tobacco: Never Used  . Tobacco comment: quit 07/2017, but uses e cig  Vaping Use  . Vaping Use: Every day  Substance and Sexual Activity  . Alcohol use: No    Alcohol/week: 0.0 standard drinks  . Drug use: No  . Sexual activity: Yes    Partners: Male    Birth control/protection: None    Comment: Partner- vasectomy   Other Topics Concern  . Not on file  Social History Narrative   Lives with her youngest daughter. Her older daughter lives in [place], [place].   Social Determinants of Health   Financial Resource Strain: Not on file  Food Insecurity: Not on file  Transportation Needs: Not on file  Physical Activity: Not on file  Stress: Not on file  Social Connections: Not on file  Intimate Partner Violence: Not on file    Review of Systems:    Constitutional: No weight loss, fever or chills Skin: No rash  Cardiovascular: No chest pain Respiratory: No SOB  Gastrointestinal: See HPI and otherwise negative Genitourinary: No dysuria Neurological: No headache, dizziness or syncope Musculoskeletal: No new muscle or joint pain Hematologic: No bleeding Psychiatric: No history of depression or anxiety   Physical Exam:  Vital signs: BP 124/76   Pulse 85   Ht 5\' 7"  (1.702 m)   Wt 201 lb (91.2 kg)   LMP 02/11/2019   BMI 31.48 kg/m   Constitutional:   Pleasant Caucasian female appears to be in NAD, Well developed, Well nourished, alert and cooperative Head:  Normocephalic and atraumatic. Eyes:   PEERL, EOMI. No icterus. Conjunctiva pink. Ears:  Normal  auditory acuity. Neck:  Supple Throat: Oral cavity and pharynx without inflammation, swelling or lesion.  Respiratory: Respirations even and unlabored. Lungs clear to auscultation bilaterally.   No wheezes, crackles, or rhonchi.  Cardiovascular: Normal S1, S2. No MRG. Regular rate and rhythm. No peripheral edema, cyanosis or pallor.  Gastrointestinal:  Soft, nondistended, nontender. No rebound or guarding. Normal bowel sounds. No appreciable masses or hepatomegaly. Rectal:  Not performed.  Msk:  Symmetrical without gross deformities. Without edema, no deformity or joint abnormality.  Neurologic:  Alert and  oriented x4;  grossly normal neurologically.  Skin:   Dry  and intact without significant lesions or rashes. Psychiatric:  Demonstrates good judgement and reason without abnormal affect or behaviors.  No recent labs or imaging.  Assessment: 1.  Bloating: Over the past 3 months, early satiety and nausea when her stomach is empty, normal bowel movements, H pylori blood testing negative per patient; consider IBS versus SIBO versus other 2.  Early satiety: History of gastritis in the past, restarted Omeprazole 2 weeks with no change in symptoms; consider gastritis versus other 3.  Screening for colorectal cancer: Patient is 60 and never had screening for colon cancer  Plan: 1.  Scheduled patient for diagnostic EGD and screening colonoscopy in the [facility] with Dr. Fuller Plan.  Did provide the patient with a detailed list of risks for the procedures and she agrees to proceed. 2.  Continue Omeprazole 20 mg daily, 30-60 minutes before breakfast 3.  Discussed that if testing above was negative/normal then could consider SIBO testing. 4.  Patient to follow in clinic per recommendations from Dr. Fuller Plan after time of procedures.  Shelly Newer, PA-C [facility] 03/23/2021, 8:41 AM

## 2021-03-23 NOTE — Progress Notes (Signed)
Reviewed and agree with management plan.  Tequan Redmon T. Jaxan Michel, MD FACG [phone]  

## 2021-03-23 NOTE — Patient Instructions (Signed)
If you are age 51 or older, your body mass index should be between 23-30. Your Body mass index is 31.48 kg/m. If this is out of the aforementioned range listed, please consider follow up with your Primary Care Provider.  If you are age 72 or younger, your body mass index should be between 19-25. Your Body mass index is 31.48 kg/m. If this is out of the aformentioned range listed, please consider follow up with your Primary Care Provider.   You have been scheduled for a colonoscopy. Please follow written instructions given to you at your visit today.  Please pick up your prep supplies at the pharmacy within the next 1-3 days. If you use inhalers (even only as needed), please bring them with you on the day of your procedure.  Continue Omeprazole 20 mg daily.  It was a pleasure to see you today!  Ellouise Newer, PA-C

## 2021-03-24 NOTE — Telephone Encounter (Signed)
Spoke with patient to discuss SIBO breat test instructions. Patient is aware that I have faxed the order to [facility] and they will call her to discuss billing her insurance and initial payment of approximately $100. Patient is aware that she will need to pick up breat test kit from the receptionist desk anytime between 8 AM - 4:30 pm, Monday through Friday. Patient is aware that if she has any questions on how to collect or submit kit she will nee to contact [facility] directly. Patient verbalized understanding of all information and had no concerns at the end of the call.   SIBO breat test order faxed to [facility].  Breath test kit placed at 3rd floor receptionist desk.

## 2021-05-21 DIAGNOSIS — M79606 Pain in leg, unspecified: Secondary | ICD-10-CM | POA: Insufficient documentation

## 2021-06-03 ENCOUNTER — Other Ambulatory Visit: Payer: Self-pay

## 2021-06-03 ENCOUNTER — Encounter: Payer: Self-pay | Admitting: Gastroenterology

## 2021-06-03 ENCOUNTER — Ambulatory Visit (AMBULATORY_SURGERY_CENTER): Payer: PRIVATE HEALTH INSURANCE | Admitting: Gastroenterology

## 2021-06-03 VITALS — BP 103/64 | HR 63 | Temp 98.4°F | Resp 20 | Ht 67.0 in | Wt 201.0 lb

## 2021-06-03 DIAGNOSIS — Z1211 Encounter for screening for malignant neoplasm of colon: Secondary | ICD-10-CM | POA: Diagnosis not present

## 2021-06-03 DIAGNOSIS — D125 Benign neoplasm of sigmoid colon: Secondary | ICD-10-CM

## 2021-06-03 DIAGNOSIS — K635 Polyp of colon: Secondary | ICD-10-CM | POA: Diagnosis not present

## 2021-06-03 DIAGNOSIS — R6881 Early satiety: Secondary | ICD-10-CM

## 2021-06-03 DIAGNOSIS — R140 Abdominal distension (gaseous): Secondary | ICD-10-CM | POA: Diagnosis not present

## 2021-06-03 DIAGNOSIS — K319 Disease of stomach and duodenum, unspecified: Secondary | ICD-10-CM

## 2021-06-03 DIAGNOSIS — K3189 Other diseases of stomach and duodenum: Secondary | ICD-10-CM | POA: Diagnosis not present

## 2021-06-03 MED ORDER — DICYCLOMINE HCL 10 MG PO CAPS: 10.0000 mg | ORAL_CAPSULE | Freq: Three times a day (TID) | ORAL | 1 refills | Status: DC

## 2021-06-03 MED ORDER — SODIUM CHLORIDE 0.9 % IV SOLN: 500.0000 mL | Freq: Once | INTRAVENOUS | Status: DC

## 2021-06-03 NOTE — Progress Notes (Signed)
Called to room to assist during endoscopic procedure.  Patient ID and intended procedure confirmed with present staff. Received instructions for my participation in the procedure from the performing physician.  

## 2021-06-03 NOTE — Op Note (Signed)
[facility] Patient Name: Shelly Blackwell Procedure Date: 06/03/2021 1:30 PM MRN: [id number] Endoscopist: Ladene Artist , MD Age: 50 Referring MD:  Date of Birth: [birth year] Gender: Female Account #: [id number] Procedure:                Colonoscopy Indications:              Screening for colorectal malignant neoplasm Medicines:                Monitored Anesthesia Care Procedure:                Pre-Anesthesia Assessment:                           - Prior to the procedure, a History and Physical                            was performed, and patient medications and                            allergies were reviewed. The patient's tolerance of                            previous anesthesia was also reviewed. The risks                            and benefits of the procedure and the sedation                            options and risks were discussed with the patient.                            All questions were answered, and informed consent                            was obtained. Prior Anticoagulants: The patient has                            taken no previous anticoagulant or antiplatelet                            agents. ASA Grade Assessment: II - A patient with                            mild systemic disease. After reviewing the risks                            and benefits, the patient was deemed in                            satisfactory condition to undergo the procedure.                           After obtaining informed consent, the colonoscope  was passed under direct vision. Throughout the                            procedure, the patient's blood pressure, pulse, and                            oxygen saturations were monitored continuously. The                            Olympus CF-HQ190 [id number]) Colonoscope was                            introduced through the anus and advanced to the the                            cecum, identified  by appendiceal orifice and                            ileocecal valve. The ileocecal valve, appendiceal                            orifice, and rectum were photographed. The quality                            of the bowel preparation was adequate after                            extensive lavage and suction. The colonoscopy was                            performed without difficulty. The patient tolerated                            the procedure well. Scope In: 1:43:56 PM Scope Out: 1:59:38 PM Scope Withdrawal Time: 0 hours 11 minutes 56 seconds  Total Procedure Duration: 0 hours 15 minutes 42 seconds  Findings:                 The perianal and digital rectal examinations were                            normal.                           A 6 mm polyp was found in the sigmoid colon. The                            polyp was sessile. The polyp was removed with a                            cold snare. Resection and retrieval were complete.                           A few small-mouthed diverticula were found in the  left colon.                           The exam was otherwise without abnormality on                            direct and retroflexion views. Complications:            No immediate complications. Estimated blood loss:                            None. Estimated Blood Loss:     Estimated blood loss: none. Impression:               - One 6 mm polyp in the sigmoid colon, removed with                            a cold snare. Resected and retrieved.                           - Mild left colon diverticulosis                           - The examination was otherwise normal on direct                            and retroflexion views. Recommendation:           - Repeat colonoscopy after studies are complete for                            surveillance based on pathology results.                           - Patient has a contact number available for                             emergencies. The signs and symptoms of potential                            delayed complications were discussed with the                            patient. Return to normal activities tomorrow.                            Written discharge instructions were provided to the                            patient.                           - High fiber diet.                           - Continue present medications.                           -  Await pathology results. Ladene Artist, MD 06/03/2021 2:01:34 PM This report has been signed electronically.

## 2021-06-03 NOTE — Op Note (Signed)
[facility] Patient Name: Shelly Blackwell Procedure Date: 06/03/2021 1:29 PM MRN: [id number] Endoscopist: Ladene Artist , MD Age: 50 Referring MD:  Date of Birth: [birth year] Gender: Female Account #: [id number] Procedure:                Upper GI endoscopy Indications:              Abdominal bloating, Early satiety Medicines:                Monitored Anesthesia Care Procedure:                Pre-Anesthesia Assessment:                           - Prior to the procedure, a History and Physical                            was performed, and patient medications and                            allergies were reviewed. The patient's tolerance of                            previous anesthesia was also reviewed. The risks                            and benefits of the procedure and the sedation                            options and risks were discussed with the patient.                            All questions were answered, and informed consent                            was obtained. Prior Anticoagulants: The patient has                            taken no previous anticoagulant or antiplatelet                            agents. ASA Grade Assessment: II - A patient with                            mild systemic disease. After reviewing the risks                            and benefits, the patient was deemed in                            satisfactory condition to undergo the procedure.                           After obtaining informed consent, the endoscope was  passed under direct vision. Throughout the                            procedure, the patient's blood pressure, pulse, and                            oxygen saturations were monitored continuously. The                            Endoscope was introduced through the mouth, and                            advanced to the second part of duodenum. The upper                            GI endoscopy was  accomplished without difficulty.                            The patient tolerated the procedure well. Scope In: Scope Out: Findings:                 The examined esophagus was normal.                           The entire examined stomach was normal. Biopsies                            were taken with a cold forceps for histology.                           The duodenal bulb and second portion of the                            duodenum were normal. Biopsies for histology were                            taken with a cold forceps for evaluation of celiac                            disease. Complications:            No immediate complications. Estimated Blood Loss:     Estimated blood loss was minimal. Impression:               - Normal esophagus.                           - Normal stomach. Biopsied.                           - Normal duodenal bulb and second portion of the                            duodenum. Biopsied. Recommendation:           - Patient has a contact number available for  emergencies. The signs and symptoms of potential                            delayed complications were discussed with the                            patient. Return to normal activities tomorrow.                            Written discharge instructions were provided to the                            patient.                           - Resume previous diet.                           - Continue present medications.                           - Dicyclomine 10 mg po ac & hs.                           - Await pathology results.                           - GI office appt with me or Ellouise Newer, PA-C                            in 6 weeks. Ladene Artist, MD 06/03/2021 2:14:29 PM This report has been signed electronically.

## 2021-06-03 NOTE — Patient Instructions (Signed)
YOU HAD AN ENDOSCOPIC PROCEDURE TODAY AT THE [facility]:   Refer to the procedure report that was given to you for any specific questions about what was found during the examination.  If the procedure report does not answer your questions, please call your gastroenterologist to clarify.  If you requested that your care partner not be given the details of your procedure findings, then the procedure report has been included in a sealed envelope for you to review at your convenience later.  YOU SHOULD EXPECT: Some feelings of bloating in the abdomen. Passage of more gas than usual.  Walking can help get rid of the air that was put into your GI tract during the procedure and reduce the bloating. If you had a lower endoscopy (such as a colonoscopy or flexible sigmoidoscopy) you may notice spotting of blood in your stool or on the toilet paper. If you underwent a bowel prep for your procedure, you may not have a normal bowel movement for a few days.  Please Note:  You might notice some irritation and congestion in your nose or some drainage.  This is from the oxygen used during your procedure.  There is no need for concern and it should clear up in a day or so.  SYMPTOMS TO REPORT IMMEDIATELY:   Following lower endoscopy (colonoscopy or flexible sigmoidoscopy):  Excessive amounts of blood in the stool  Significant tenderness or worsening of abdominal pains  Swelling of the abdomen that is new, acute  Fever of 100F or higher   Following upper endoscopy (EGD)  Vomiting of blood or coffee ground material  New chest pain or pain under the shoulder blades  Painful or persistently difficult swallowing  New shortness of breath  Fever of 100F or higher  Black, tarry-looking stools  For urgent or emergent issues, a gastroenterologist can be reached at any hour by calling [phone]. Do not use MyChart messaging for urgent concerns.    DIET:  We do recommend a small meal at first, but  then you may proceed to your regular diet.  Drink plenty of fluids but you should avoid alcoholic beverages for 24 hours.  ACTIVITY:  You should plan to take it easy for the rest of today and you should NOT DRIVE or use heavy machinery until tomorrow (because of the sedation medicines used during the test).    FOLLOW UP: Our staff will call the number listed on your records 48-72 hours following your procedure to check on you and address any questions or concerns that you may have regarding the information given to you following your procedure. If we do not reach you, we will leave a message.  We will attempt to reach you two times.  During this call, we will ask if you have developed any symptoms of COVID 19. If you develop any symptoms (ie: fever, flu-like symptoms, shortness of breath, cough etc.) before then, please call [phone].  If you test positive for Covid 19 in the 2 weeks post procedure, please call and report this information to us.    If any biopsies were taken you will be contacted by phone or by letter within the next 1-3 weeks.  Please call us at [phone] if you have not heard about the biopsies in 3 weeks.    SIGNATURES/CONFIDENTIALITY: You and/or your care partner have signed paperwork which will be entered into your electronic medical record.  These signatures attest to the fact that that the information above on   your After Visit Summary has been reviewed and is understood.  Full responsibility of the confidentiality of this discharge information lies with you and/or your care-partner. 

## 2021-06-03 NOTE — Progress Notes (Signed)
PT taken to PACU. Monitors in place. VSS. Report given to RN. 

## 2021-06-05 ENCOUNTER — Telehealth: Payer: Self-pay

## 2021-06-05 NOTE — Telephone Encounter (Signed)
  Follow up Call-  Call back number 06/03/2021  Post procedure Call Back phone  # [phone] cell  Permission to leave phone message Yes  Some recent data might be hidden     Patient questions:  Do you have a fever, pain , or abdominal swelling? No. Pain Score  0 *  Have you tolerated food without any problems? yes  Have you been able to return to your normal activities? Yes.    Do you have any questions about your discharge instructions: Diet   No. Medications  No. Follow up visit  No.  Do you have questions or concerns about your Care? No.  Actions: * If pain score is 4 or above: No action needed, pain <4.  Have you developed a fever since your procedure? no  2.   Have you had an respiratory symptoms (SOB or cough) since your procedure? no  3.   Have you tested positive for COVID 19 since your procedure no  4.   Have you had any family members/close contacts diagnosed with the COVID 19 since your procedure?  no   If yes to any of these questions please route to Joylene John, RN and Joella Prince, RN

## 2021-06-05 NOTE — Telephone Encounter (Signed)
First attempt follow up call to pt, no answer. 

## 2021-06-15 ENCOUNTER — Encounter: Payer: Self-pay | Admitting: Gastroenterology

## 2021-06-22 DIAGNOSIS — M5412 Radiculopathy, cervical region: Secondary | ICD-10-CM | POA: Insufficient documentation

## 2021-06-25 DIAGNOSIS — R030 Elevated blood-pressure reading, without diagnosis of hypertension: Secondary | ICD-10-CM | POA: Insufficient documentation

## 2021-06-25 DIAGNOSIS — M47812 Spondylosis without myelopathy or radiculopathy, cervical region: Secondary | ICD-10-CM | POA: Insufficient documentation

## 2021-07-06 ENCOUNTER — Other Ambulatory Visit: Payer: Self-pay

## 2021-07-06 ENCOUNTER — Encounter: Payer: Self-pay | Admitting: Physical Therapy

## 2021-07-06 ENCOUNTER — Ambulatory Visit: Payer: No Typology Code available for payment source | Attending: Neurosurgery | Admitting: Physical Therapy

## 2021-07-06 DIAGNOSIS — M542 Cervicalgia: Secondary | ICD-10-CM | POA: Insufficient documentation

## 2021-07-06 DIAGNOSIS — M5412 Radiculopathy, cervical region: Secondary | ICD-10-CM | POA: Diagnosis not present

## 2021-07-06 DIAGNOSIS — M6281 Muscle weakness (generalized): Secondary | ICD-10-CM | POA: Insufficient documentation

## 2021-07-06 NOTE — Patient Instructions (Signed)
Access Code: [email or web address] URL: [email or web address] Date: 07/06/2021 Prepared by: Venetia Night Josph Norfleet  Exercises Supine Cervical Retraction with Towel - 3 x daily - 7 x weekly - 1 sets - 10 reps - 5 hold Seated Passive Cervical Retraction - 3 x daily - 7 x weekly - 1 sets - 10 reps - 5 hold Seated Backward Shoulder Rolls - 3 x daily - 7 x weekly - 1 sets - 10 reps Seated Levator Scapulae Stretch - 3 x daily - 7 x weekly - 1 sets - 3 reps - 20 hold  Patient Education Trigger Point Dry Needling

## 2021-07-06 NOTE — Therapy (Signed)
[facility] [address] [place], [place], [address] Phone: [phone]   Fax:  [phone]  Physical Therapy Evaluation  Patient Details  Name: Shelly Blackwell MRN: [id number] Date of Birth: 17-Nov-1971 Referring Provider (PT): Consuella Lose, MD   Encounter Date: 07/06/2021   PT End of Session - 07/06/21 0759     Visit Number 1    Date for PT Re-Evaluation 08/17/21    Authorization Type [employer]    PT Start Time 0800    PT Stop Time 0842    PT Time Calculation (min) 42 min    Activity Tolerance Patient tolerated treatment well    Behavior During Therapy [facility] for tasks assessed/performed             Past Medical History:  Diagnosis Date   Depression    Gastritis    GERD (gastroesophageal reflux disease)    History of palpitations    Kidney stones    h/o-no surgery    Past Surgical History:  Procedure Laterality Date   AUGMENTATION MAMMAPLASTY Bilateral    2008   BREAST ENHANCEMENT SURGERY  2009   [place]   left foot   [place], 2001   x2   ESOPHAGEAL MANOMETRY N/A 09/19/2017   Procedure: ESOPHAGEAL MANOMETRY (EM);  Surgeon: Mauri Pole, MD;  Location: [facility];  Service: Endoscopy;  Laterality: N/A;   LAPAROSCOPIC CHOLECYSTECTOMY  [id number]   UMBILICAL HERNIA REPAIR N/A 11/01/2013   Procedure: HERNIA REPAIR UMBILICAL ADULT;  Surgeon: Adin Hector, MD;  Location: [facility];  Service: General;  Laterality: N/A;    There were no vitals filed for this visit.    Subjective Assessment - 07/06/21 0758     Subjective Pt is referred to OPPT with ongoing neck pain that extends into Rt UE.  Symptoms have been present x 9-10 years.  Pt has tried chiropractic care and meds without relief.  Last 6 mos has been more into Rt UE.  Pt reports pins/needles/itching into Rt digits 1-3 and has burning pain in Rt side of neck and shoulder.  Pt has been dropping coffee lately.     Pertinent History Pt has NCV 07/27/21, ESI scheduled for 8/5    Limitations Sitting    How long can you sit comfortably? 20 min, uses a kneeling chair for work, uses 3 computer screens for work    Diagnostic tests M47.22 (ICD-10-CM) - Other spondylosis with radiculopathy, cervical region  cervical MRI 06/14/21: Rt sided disc osteophyte and lig flavum thickening, mod foraminal stenosis, pronounced Rt C5/6 facet degenerative disease    Patient Stated Goals relief, learn HEP    Currently in Pain? Yes    Pain Location Neck    Pain Orientation Right    Pain Descriptors / Indicators Burning;Pins and needles;Dull    Pain Type Chronic pain    Pain Radiating Towards Rt UE to digits 1-3    Pain Onset More than a month ago    Pain Frequency Intermittent    Aggravating Factors  computer work, turning head to Rt and looking up    Pain Relieving Factors neck traction unit at home, stretching neck to Lt, massage    Effect of Pain on Daily Activities pain with computer work for job                [facility] PT Assessment - 07/06/21 0001       Assessment   Medical Diagnosis M47.22 (  ICD-10-CM) - Other spondylosis with radiculopathy, cervical region    Referring Provider (PT) Consuella Lose, MD    Onset Date/Surgical Date --   10 years ago, worse into Rt UE last 6 mos   Hand Dominance Right    Next MD Visit 07/17/21   ESI   Prior Therapy chirco care      Precautions   Precautions None      Restrictions   Weight Bearing Restrictions No      Balance Screen   Has the patient fallen in the past 6 months Yes    How many times? 2    Has the patient had a decrease in activity level because of a fear of falling?  No    Is the patient reluctant to leave their home because of a fear of falling?  No      Home [place] residence    Living Arrangements Other relatives   35yo grandson     Prior Function   Level of Independence Independent    Vocation Full time  employment    Press photographer work, 3 monitors    Leisure 3yo grandson, yard work      Observation/Other Assessments   Focus on Therapeutic Outcomes (FOTO)  46% goal 56%      ROM / Strength   AROM / PROM / Strength AROM;Strength      AROM   AROM Assessment Site Cervical    Cervical Flexion 48    Cervical Extension 38    Cervical - Right Side Bend 28    Cervical - Left Side Bend 32    Cervical - Right Rotation 55    Cervical - Left Rotation 62      Strength   Strength Assessment Site Elbow;Wrist;Hand    Right/Left Elbow Right;Left    Right Elbow Flexion 5/5    Left Elbow Flexion 5/5    Right/Left Wrist Right;Left    Right Wrist Extension 5/5    Left Wrist Extension 5/5    Right/Left hand Right;Left    Right Hand Grip (lbs) 40    Left Hand Grip (lbs) 68      Palpation   Spinal mobility limited PAs T1-T3, limited upglide Rt C5/6, C6/7    Palpation comment Rt pectorals, Rt upper trap and levator, cervical paraspinals and multifidi C5-T3      Special Tests    Special Tests Cervical    Cervical Tests Spurling's;other      Spurling's   Findings Positive    Side Right    Comment closing mechanics = Rt UE symptoms      other    Findings Positive    Side Right    Comment + ULTT Rt ulnar and radial and medial toward end range                        Objective measurements completed on examination: See above findings.                 PT Short Term Goals - 07/06/21 0847       PT SHORT TERM GOAL #1   Title Pt ind with initial HEP    Time 2    Period Weeks    Status New    Target Date 07/20/21      PT SHORT TERM GOAL #2   Title Pt will be able to tolerate at least 30 min of computer  work with min pain    Baseline 20 min    Time 3    Period Weeks    Status New    Target Date 07/27/21      PT SHORT TERM GOAL #3   Title Improve Rt rotation to at least 60 deg with min arm and neck pain    Baseline 55    Time 4    Period  Weeks    Status New    Target Date 08/03/21      PT SHORT TERM GOAL #4   Title Pt will report at least 25% reduction of pain during work day.    Time 4    Period Weeks    Status New    Target Date 08/03/21               PT Long Term Goals - 07/06/21 0845       PT LONG TERM GOAL #1   Title Pt will be ind with advanced HEP and understand how to safely progress    Time 6    Period Weeks    Status New    Target Date 08/17/21      PT LONG TERM GOAL #2   Title Pt will improve FOTO score from 46% to at least 56% to demo improved function.    Time 6    Period Weeks    Status New    Target Date 08/17/21      PT LONG TERM GOAL #3   Title Pt will be able to tolerate seated computer work for at least 1 hour at a time with min or no pain.    Time 6    Period Weeks    Status New    Target Date 08/17/21      PT LONG TERM GOAL #4   Title Pt will achieve Rt grip strength within 5lb of Lt UE    Time 6    Period Weeks    Status New    Target Date 08/17/21      PT LONG TERM GOAL #5   Title Pt will demo improved Rt rotation to at least 65 deg to improve visibility with driving and scanning multiple computer monitors for work    Baseline 55    Time 6    Period Weeks    Status New    Target Date 08/17/21                    Plan - 07/06/21 1255     Clinical Impression Statement Pt is a pleasant 50yo female with chronic neck and Rt UE pain x 10 years with worsening of Rt dominant UE symptoms over the past 6 mos.  Pt reports burning pain in neck and shoulder with pins/needles/numbness into Rt thumb and 2nd digit.  She has been dropping items in Rt hand lately.  Recent MRI shows mod foraminal stenosis at C5/6 with signif DDD at this level.  She is scheduled for ESI and NCV testing in Aug.  She rates pain as 4/10, worse with sitting which she does for work using 3 computer monitors.  She has limited ROM with exacerbation of Rt UE into closing patterns of extension, Rt SB  and Rt Rot.  TPs are present in upper traps, levator and cervical and upper thoracic multifidi with limited thoracic PAs T1-T3.  Elbow and wrist strength are symmetrical and 5/5 but gross grip is weak compared to Lt side (40lb  Rt 68lb Lt).  Pt reports relief with traction (has a home unit) and stretching into opening positions of flexion/Lt SB and Lt Rot.  FOTO score is 46% with goal of 56%.  Pt will benefit from skilled PT to address pain and deficits to improve overall QOL and function.    Personal Factors and Comorbidities Time since onset of injury/illness/exacerbation    Examination-Activity Limitations Sit;Lift;Carry    Examination-Participation Restrictions Occupation;Driving;Cleaning    Stability/Clinical Decision Making Stable/Uncomplicated    Clinical Decision Making Low    Rehab Potential Good    PT Frequency 2x / week    PT Duration 6 weeks    PT Treatment/Interventions ADLs/Self Care Home Management;Electrical Stimulation;Cryotherapy;Moist Heat;Traction;Neuromuscular re-education;Therapeutic exercise;Patient/family education;Manual techniques;Passive range of motion;Dry needling;Taping;Joint Manipulations;Spinal Manipulations;Therapeutic activities;Functional mobility training    PT Next Visit Plan add grip strength for Rt UE to HEP, DN cervical multifidi and bil upper traps/levator, mechanical cervical traction in slight flexion    PT Home Exercise Plan Access Code: [email or web address] and DN info    Consulted and Agree with Plan of Care Patient             Patient will benefit from skilled therapeutic intervention in order to improve the following deficits and impairments:  Decreased range of motion, Increased fascial restricitons, Impaired UE functional use, Increased muscle spasms, Decreased activity tolerance, Pain, Hypomobility, Impaired flexibility, Decreased strength  Visit Diagnosis: Radiculopathy, cervical region - Plan: PT plan of care cert/re-cert  Cervicalgia - Plan: PT plan  of care cert/re-cert  Muscle weakness (generalized) - Plan: PT plan of care cert/re-cert     Problem List Patient Active Problem List   Diagnosis Date Noted   Vitamin D deficiency 01/09/2019   Dysphagia    Laryngopharyngeal reflux 09/23/2014   Tobacco abuse 10/01/2013   Incisional hernia - periumbilical 99991111   PVC's (premature ventricular contractions) 09/14/2013    Elliot Meldrum, PT 07/06/21 1:09 PM   [facility] [address], [place], [place], [address] Phone: [phone]   Fax:  [phone]  Name: Shelly Blackwell MRN: [id number] Date of Birth: 07-30-1971

## 2021-07-08 ENCOUNTER — Ambulatory Visit: Payer: Self-pay | Admitting: Physical Therapy

## 2021-07-08 ENCOUNTER — Encounter: Payer: Self-pay | Admitting: Physical Therapy

## 2021-07-08 ENCOUNTER — Ambulatory Visit: Payer: No Typology Code available for payment source | Admitting: Physical Therapy

## 2021-07-08 ENCOUNTER — Other Ambulatory Visit: Payer: Self-pay

## 2021-07-08 DIAGNOSIS — M542 Cervicalgia: Secondary | ICD-10-CM

## 2021-07-08 DIAGNOSIS — M5412 Radiculopathy, cervical region: Secondary | ICD-10-CM | POA: Diagnosis not present

## 2021-07-08 DIAGNOSIS — M6281 Muscle weakness (generalized): Secondary | ICD-10-CM

## 2021-07-08 NOTE — Therapy (Signed)
[facility] [address] [place], [place], [address] Phone: [phone]   Fax:  [phone]  Physical Therapy Treatment  Patient Details  Name: Shelly Blackwell MRN: [id number] Date of Birth: [birth year] Referring Provider (PT): Consuella Lose, MD   Encounter Date: 07/08/2021   PT End of Session - 07/08/21 0844     Visit Number 2    Date for PT Re-Evaluation 08/17/21    Authorization Type [employer]    PT Start Time 0803    PT Stop Time 0843    PT Time Calculation (min) 40 min    Activity Tolerance Patient tolerated treatment well    Behavior During Therapy [facility] for tasks assessed/performed             Past Medical History:  Diagnosis Date   Depression    Gastritis    GERD (gastroesophageal reflux disease)    History of palpitations    Kidney stones    h/o-no surgery    Past Surgical History:  Procedure Laterality Date   AUGMENTATION MAMMAPLASTY Bilateral    2008   BREAST ENHANCEMENT SURGERY  2009   [place]   left foot   [place], 2001   x2   ESOPHAGEAL MANOMETRY N/A 09/19/2017   Procedure: ESOPHAGEAL MANOMETRY (EM);  Surgeon: Mauri Pole, MD;  Location: [facility];  Service: Endoscopy;  Laterality: N/A;   LAPAROSCOPIC CHOLECYSTECTOMY  [id number]   UMBILICAL HERNIA REPAIR N/A 11/01/2013   Procedure: HERNIA REPAIR UMBILICAL ADULT;  Surgeon: Adin Hector, MD;  Location: [facility];  Service: General;  Laterality: N/A;    There were no vitals filed for this visit.   Subjective Assessment - 07/08/21 0801     Subjective I am doing ok today but that is because it is early.  My neck and shoulder burned so bad yesterday.  It always starts after I have to drive x 1 hour in AM for my grandson and work commute.    Pertinent History Pt has NCV 07/27/21, ESI scheduled for 8/5    Limitations Sitting    How long can you sit comfortably? 20 min, uses a kneeling chair for work,  uses 3 computer screens for work    Diagnostic tests M47.22 (ICD-10-CM) - Other spondylosis with radiculopathy, cervical region  cervical MRI 06/14/21: Rt sided disc osteophyte and lig flavum thickening, mod foraminal stenosis, pronounced Rt C5/6 facet degenerative disease    Patient Stated Goals relief, learn HEP    Currently in Pain? Yes    Pain Score 2     Pain Location Neck    Pain Orientation Right    Pain Type Acute pain    Pain Radiating Towards right shoulder    Pain Onset More than a month ago    Pain Frequency Intermittent                               [facility] Adult PT Treatment/Exercise - 07/08/21 0001       Self-Care   Self-Care Other Self-Care Comments    Other Self-Care Comments  use of tennis ball against wall for Rt intrascapular TP release      Exercises   Exercises Hand;Neck      Neck Exercises: Supine   Neck Retraction 5 reps;5 secs    Neck Retraction Limitations with towel roll under neck, VC to broaden collar bones for scapular imprint into  table      Hand Exercises   Other Hand Exercises towel squeeze 10x3 sec    Other Hand Exercises putty grip squeeze 10x3 sec, 1-2 min Rt hand putty manipulation (verbally instructed and added to HEP)      Manual Therapy   Manual Therapy Joint mobilization;Soft tissue mobilization    Joint Mobilization Thoracic PAs and UPAs on Rt T1-T6 Gr II/III    Soft tissue mobilization Rt upper trap, levator, rhomboid              Trigger Point Dry Needling - 07/08/21 0001     Consent Given? Yes    Education Handout Provided Previously provided    Muscles Treated Head and Neck Upper trapezius;Cervical multifidi    Dry Needling Comments bil upper trap, Rt C5-C7 multifidi    Upper Trapezius Response Twitch reponse elicited;Palpable increased muscle length   Rt only with twitch   Cervical multifidi Response Twitch reponse elicited;Palpable increased muscle length                    PT Short Term  Goals - 07/06/21 0847       PT SHORT TERM GOAL #1   Title Pt ind with initial HEP    Time 2    Period Weeks    Status New    Target Date 07/20/21      PT SHORT TERM GOAL #2   Title Pt will be able to tolerate at least 30 min of computer work with min pain    Baseline 20 min    Time 3    Period Weeks    Status New    Target Date 07/27/21      PT SHORT TERM GOAL #3   Title Improve Rt rotation to at least 60 deg with min arm and neck pain    Baseline 55    Time 4    Period Weeks    Status New    Target Date 08/03/21      PT SHORT TERM GOAL #4   Title Pt will report at least 25% reduction of pain during work day.    Time 4    Period Weeks    Status New    Target Date 08/03/21               PT Long Term Goals - 07/06/21 0845       PT LONG TERM GOAL #1   Title Pt will be ind with advanced HEP and understand how to safely progress    Time 6    Period Weeks    Status New    Target Date 08/17/21      PT LONG TERM GOAL #2   Title Pt will improve FOTO score from 46% to at least 56% to demo improved function.    Time 6    Period Weeks    Status New    Target Date 08/17/21      PT LONG TERM GOAL #3   Title Pt will be able to tolerate seated computer work for at least 1 hour at a time with min or no pain.    Time 6    Period Weeks    Status New    Target Date 08/17/21      PT LONG TERM GOAL #4   Title Pt will achieve Rt grip strength within 5lb of Lt UE    Time 6    Period Weeks  Status New    Target Date 08/17/21      PT LONG TERM GOAL #5   Title Pt will demo improved Rt rotation to at least 65 deg to improve visibility with driving and scanning multiple computer monitors for work    Baseline 55    Time 6    Period Weeks    Status New    Target Date 08/17/21                   Plan - 07/08/21 0845     Clinical Impression Statement Pt arrived with low level of pain and without Rt UE symptoms due to AM appointment and pattern of symptoms  being best in AM.  She demo'd improved Rt cervical rotation ROM today compared to evaluation measuring 70 deg, with mild end range pain.  PT performed DN #1 with signif twitch and release of Rt upper trap and Rt cervical multifidi.  Thoracic spine is hypomobile on Rt and centrally so PAs and UPAs used with improved joint mobility end of session. PT progressed Rt UE grip strength exercises for HEP given Pt's weakness and history of dropping items.  Aftercare for DN reiterated by PT end of session and PT encouraged Pt to try tennis ball release against wall for Rt intrascapular pain.  Continue along POC with ongoing assessment.    PT Frequency 2x / week    PT Duration 6 weeks    PT Treatment/Interventions ADLs/Self Care Home Management;Electrical Stimulation;Cryotherapy;Moist Heat;Traction;Neuromuscular re-education;Therapeutic exercise;Patient/family education;Manual techniques;Passive range of motion;Dry needling;Taping;Joint Manipulations;Spinal Manipulations;Therapeutic activities;Functional mobility training    PT Next Visit Plan f/u on DN #1, cervical mechanic traction or manual traction, thoracic mobs, progress postural stab for scapulae, try single arm Rt pec doorway stretch    PT Home Exercise Plan Access Code: [email or web address] and DN info    Consulted and Agree with Plan of Care Patient             Patient will benefit from skilled therapeutic intervention in order to improve the following deficits and impairments:     Visit Diagnosis: Radiculopathy, cervical region  Cervicalgia  Muscle weakness (generalized)     Problem List Patient Active Problem List   Diagnosis Date Noted   Vitamin D deficiency 01/09/2019   Dysphagia    Laryngopharyngeal reflux 09/23/2014   Tobacco abuse 10/01/2013   Incisional hernia - periumbilical 99991111   PVC's (premature ventricular contractions) 09/14/2013    Primitivo Merkey, PT 07/08/21 8:49 AM   [facility] [address], [place], [place], [address] Phone: [phone]   Fax:  [phone]  Name: Amrita Tomerlin MRN: [id number] Date of Birth: [birth year]

## 2021-07-09 ENCOUNTER — Encounter: Payer: Self-pay | Admitting: Physical Therapy

## 2021-07-13 ENCOUNTER — Ambulatory Visit: Payer: No Typology Code available for payment source | Attending: Neurosurgery | Admitting: Physical Therapy

## 2021-07-13 ENCOUNTER — Encounter: Payer: Self-pay | Admitting: Physical Therapy

## 2021-07-13 ENCOUNTER — Other Ambulatory Visit: Payer: Self-pay

## 2021-07-13 DIAGNOSIS — M5412 Radiculopathy, cervical region: Secondary | ICD-10-CM | POA: Insufficient documentation

## 2021-07-13 DIAGNOSIS — R252 Cramp and spasm: Secondary | ICD-10-CM | POA: Insufficient documentation

## 2021-07-13 DIAGNOSIS — R293 Abnormal posture: Secondary | ICD-10-CM | POA: Insufficient documentation

## 2021-07-13 DIAGNOSIS — M6281 Muscle weakness (generalized): Secondary | ICD-10-CM | POA: Diagnosis present

## 2021-07-13 DIAGNOSIS — M542 Cervicalgia: Secondary | ICD-10-CM | POA: Diagnosis present

## 2021-07-13 NOTE — Therapy (Signed)
[facility] [address], [place], [place], [address] Phone: [phone]   Fax:  [phone]  Physical Therapy Treatment  Patient Details  Name: Shelly Blackwell MRN: [id number] Date of Birth: 06-21-1971 Referring Provider (PT): Consuella Lose, MD   Encounter Date: 07/13/2021   PT End of Session - 07/13/21 0813     Visit Number 3    Date for PT Re-Evaluation 08/17/21    Authorization Type [employer]    PT Start Time 0807   Pt late   PT Stop Time 0848    PT Time Calculation (min) 41 min    Activity Tolerance Patient tolerated treatment well    Behavior During Therapy [facility] for tasks assessed/performed             Past Medical History:  Diagnosis Date   Depression    Gastritis    GERD (gastroesophageal reflux disease)    History of palpitations    Kidney stones    h/o-no surgery    Past Surgical History:  Procedure Laterality Date   AUGMENTATION MAMMAPLASTY Bilateral    2008   BREAST ENHANCEMENT SURGERY  2009   [place]   left foot   [place], 2001   x2   ESOPHAGEAL MANOMETRY N/A 09/19/2017   Procedure: ESOPHAGEAL MANOMETRY (EM);  Surgeon: Mauri Pole, MD;  Location: [facility];  Service: Endoscopy;  Laterality: N/A;   LAPAROSCOPIC CHOLECYSTECTOMY  [id number]   UMBILICAL HERNIA REPAIR N/A 11/01/2013   Procedure: HERNIA REPAIR UMBILICAL ADULT;  Surgeon: Adin Hector, MD;  Location: [facility];  Service: General;  Laterality: N/A;    There were no vitals filed for this visit.   Subjective Assessment - 07/13/21 0812     Subjective DN helped my pain and tension so much last time - lasted about 2-3 days.  No change in hand numbness/tingling.    Pertinent History Pt has NCV 07/27/21, ESI scheduled for 8/5    How long can you sit comfortably? 20 min, uses a kneeling chair for work, uses 3 computer screens for work    Diagnostic tests M47.22 (ICD-10-CM) - Other spondylosis  with radiculopathy, cervical region  cervical MRI 06/14/21: Rt sided disc osteophyte and lig flavum thickening, mod foraminal stenosis, pronounced Rt C5/6 facet degenerative disease    Currently in Pain? Yes    Pain Score 1     Pain Location Shoulder    Pain Orientation Right    Pain Descriptors / Indicators Burning;Aching;Pins and needles;Dull    Pain Type Acute pain    Pain Radiating Towards right shoulder    Pain Onset More than a month ago    Pain Frequency Intermittent    Aggravating Factors  computer work                               [employer] Adult PT Treatment/Exercise - 07/13/21 0001       Self-Care   Self-Care Other Self-Care Comments    Other Self-Care Comments  Rt arm prop on pillows on arm rest to slacken Rt neck muscles and neural structures intermitently at work for relief      Exercises   Exercises Neck;Shoulder      Neck Exercises: Supine   Cervical Isometrics Right lateral flexion;Left lateral flexion;Right rotation;Left rotation;3 secs;5 reps    Cervical Isometrics Limitations laying on soft foam roller    Neck Retraction 5  reps;5 secs    Neck Retraction Limitations lying on soft foam roller with towel roll under neck    Shoulder Flexion Both;5 reps    Shoulder Flexion Limitations AA/ROM with dowel lying on soft roller    Other Supine Exercise spinal decompression lying vertical on foam roller x 3'      Shoulder Exercises: Supine   Horizontal ABduction Strengthening;10 reps;Theraband    Theraband Level (Shoulder Horizontal ABduction) Level 1 (Yellow)    Horizontal ABduction Limitations on soft foam roller    External Rotation Strengthening;Both;10 reps;Theraband    Theraband Level (Shoulder External Rotation) Level 1 (Yellow)    External Rotation Limitations on soft foam roller      Shoulder Exercises: Sidelying   Other Sidelying Exercises open books x 5 Rt/Lt      Manual Therapy   Joint Mobilization Rt UPAs and rib springing Gr II/III  T3-T6    Soft tissue mobilization Rt levator and cervical multifidi following DN              Trigger Point Dry Needling - 07/13/21 0001     Consent Given? Yes    Education Handout Provided Previously provided    Muscles Treated Head and Neck Upper trapezius;Cervical multifidi   Rt   Dry Needling Comments Rt only, C5-C7    Upper Trapezius Response Twitch reponse elicited;Palpable increased muscle length    Cervical multifidi Response Twitch reponse elicited;Palpable increased muscle length                    PT Short Term Goals - 07/13/21 0857       PT SHORT TERM GOAL #1   Title Pt ind with initial HEP    Status On-going      PT SHORT TERM GOAL #2   Title Pt will be able to tolerate at least 30 min of computer work with min pain    Status On-going               PT Long Term Goals - 07/06/21 0845       PT LONG TERM GOAL #1   Title Pt will be ind with advanced HEP and understand how to safely progress    Time 6    Period Weeks    Status New    Target Date 08/17/21      PT LONG TERM GOAL #2   Title Pt will improve FOTO score from 46% to at least 56% to demo improved function.    Time 6    Period Weeks    Status New    Target Date 08/17/21      PT LONG TERM GOAL #3   Title Pt will be able to tolerate seated computer work for at least 1 hour at a time with min or no pain.    Time 6    Period Weeks    Status New    Target Date 08/17/21      PT LONG TERM GOAL #4   Title Pt will achieve Rt grip strength within 5lb of Lt UE    Time 6    Period Weeks    Status New    Target Date 08/17/21      PT LONG TERM GOAL #5   Title Pt will demo improved Rt rotation to at least 65 deg to improve visibility with driving and scanning multiple computer monitors for work    Baseline 55    Time 6  Period Weeks    Status New    Target Date 08/17/21                   Plan - 07/13/21 0851     Clinical Impression Statement Pt reported relief with  DN #1 last visit x 2-3 days and presented with smaller region of TPs in Rt upper trap today. Ongoing signif TPs in Rt cervical multifidi C5-T1 with signif twitch and release with DN #2 today.  PT progressed HEP to include soft foam roller lying/decompression, cervical isometrics and open books which Pt felt much relief with.  Pt is working on grip strength at home for Rt UE using stress balls.  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  She uses cervical collar to give neck a break intermittently and PT encouraged a Rt arm prop on arm rest with pillows for additional relief.  Discussed optimal work station set up as well.  She uses a home cervical traction unit which provides relief so PT recommends trial of mechanical neck traction next visit.  No changes to date on Rt hand numbness/pins and needles but Pt pleased with neck and shoulder pain relief so far.  She will get ESI on Fri this week.    Rehab Potential Good    PT Frequency 2x / week    PT Duration 6 weeks    PT Treatment/Interventions ADLs/Self Care Home Management;Electrical Stimulation;Cryotherapy;Moist Heat;Traction;Neuromuscular re-education;Therapeutic exercise;Patient/family education;Manual techniques;Passive range of motion;Dry needling;Taping;Joint Manipulations;Spinal Manipulations;Therapeutic activities;Functional mobility training    PT Next Visit Plan start with soft foam roller lying vertically along spine for decompression x 3', review HEP and f/u on DN #2, trial of cervical mechanical traction 12-15lb in slight flexion, add yellow band row and try pec doorway stretch    PT Home Exercise Plan Access Code: [email or web address] and DN info    Consulted and Agree with Plan of Care Patient             Patient will benefit from skilled therapeutic intervention in order to improve the following deficits and impairments:     Visit Diagnosis: Radiculopathy, cervical  region  Cervicalgia  Muscle weakness (generalized)     Problem List Patient Active Problem List   Diagnosis Date Noted   Vitamin D deficiency 01/09/2019   Dysphagia    Laryngopharyngeal reflux 09/23/2014   Tobacco abuse 10/01/2013   Incisional hernia - periumbilical [id number]   PVC's (premature ventricular contractions) 09/14/2013    Juletta Berhe, PT 07/13/21 8:58 AM    [facility] [address] [place], [place], [address] Phone: [phone]   Fax:  [phone]  Name: Shelly Blackwell MRN: [id number] Date of Birth: [birth year]

## 2021-07-15 ENCOUNTER — Encounter: Payer: Self-pay | Admitting: Physical Therapy

## 2021-07-16 ENCOUNTER — Other Ambulatory Visit: Payer: Self-pay

## 2021-07-16 ENCOUNTER — Ambulatory Visit: Payer: No Typology Code available for payment source | Admitting: Physical Therapy

## 2021-07-16 DIAGNOSIS — M5412 Radiculopathy, cervical region: Secondary | ICD-10-CM | POA: Diagnosis not present

## 2021-07-16 DIAGNOSIS — M6281 Muscle weakness (generalized): Secondary | ICD-10-CM

## 2021-07-16 DIAGNOSIS — R293 Abnormal posture: Secondary | ICD-10-CM

## 2021-07-16 DIAGNOSIS — R252 Cramp and spasm: Secondary | ICD-10-CM

## 2021-07-16 NOTE — Therapy (Signed)
[facility] [address] [place], [place], [address] Phone: [phone]   Fax:  [phone]  Physical Therapy Treatment  Patient Details  Name: Shelly Blackwell MRN: [id number] Date of Birth: 02-Dec-1971 Referring Provider (PT): Consuella Lose, MD   Encounter Date: 07/16/2021   PT End of Session - 07/16/21 0831     Visit Number 4    Date for PT Re-Evaluation 08/17/21    Authorization Type [employer]    PT Start Time 0815    PT Stop Time 0845    PT Time Calculation (min) 30 min    Activity Tolerance Patient tolerated treatment well    Behavior During Therapy [facility] for tasks assessed/performed             Past Medical History:  Diagnosis Date   Depression    Gastritis    GERD (gastroesophageal reflux disease)    History of palpitations    Kidney stones    h/o-no surgery    Past Surgical History:  Procedure Laterality Date   AUGMENTATION MAMMAPLASTY Bilateral    2008   BREAST ENHANCEMENT SURGERY  2009   [place]   left foot   [place], 2001   x2   ESOPHAGEAL MANOMETRY N/A 09/19/2017   Procedure: ESOPHAGEAL MANOMETRY (EM);  Surgeon: Mauri Pole, MD;  Location: [facility];  Service: Endoscopy;  Laterality: N/A;   LAPAROSCOPIC CHOLECYSTECTOMY  [id number]   UMBILICAL HERNIA REPAIR N/A 11/01/2013   Procedure: HERNIA REPAIR UMBILICAL ADULT;  Surgeon: Adin Hector, MD;  Location: [facility];  Service: General;  Laterality: N/A;    There were no vitals filed for this visit.   Subjective Assessment - 07/16/21 0815     Subjective Pt reports tension helps pain a lot and wearing neck brace after work at home helps. DN also helped alot after first time and had some soreness after second time.    Pertinent History Pt has NCV 07/27/21, ESI scheduled for 8/5    Limitations Sitting    How long can you sit comfortably? 20 min, uses a kneeling chair for work, uses 3 computer screens for  work    Diagnostic tests M47.22 (ICD-10-CM) - Other spondylosis with radiculopathy, cervical region  cervical MRI 06/14/21: Rt sided disc osteophyte and lig flavum thickening, mod foraminal stenosis, pronounced Rt C5/6 facet degenerative disease    Patient Stated Goals relief, learn HEP    Currently in Pain? Yes    Pain Score 2     Pain Location Neck    Pain Orientation Mid    Pain Descriptors / Indicators Dull;Aching    Pain Type Acute pain    Pain Onset More than a month ago    Pain Frequency Intermittent                               [facility] Adult PT Treatment/Exercise - 07/16/21 0001       Exercises   Exercises Neck;Shoulder      Neck Exercises: Theraband   Rows 10 reps   yellow band in sitting     Neck Exercises: Stretches   Upper Trapezius Stretch Left;2 reps;20 seconds    Other Neck Stretches Lt rotation 2x20s, flexion, and side bending      Shoulder Exercises: Supine   Horizontal ABduction Strengthening;10 reps;Theraband    Theraband Level (Shoulder Horizontal ABduction) Level 1 (Yellow)    Horizontal  ABduction Limitations on soft foam roller    External Rotation Strengthening;Both;10 reps;Theraband    Theraband Level (Shoulder External Rotation) Level 1 (Yellow)    External Rotation Limitations on soft foam roller    Other Supine Exercises open books on foam roller 2x10      Manual Therapy   Manual Therapy Myofascial release    Myofascial Release Rt upper trap and levator with two large trigger points noted, mild release noted with manual work here and pt reported she felt relief at end of session with this.                    PT Education - 07/16/21 0849     Education Details Pt educated on breathing techniques with exercises and cues for all exercises during session.              PT Short Term Goals - 07/13/21 0857       PT SHORT TERM GOAL #1   Title Pt ind with initial HEP    Status On-going      PT SHORT TERM GOAL #2    Title Pt will be able to tolerate at least 30 min of computer work with min pain    Status On-going               PT Long Term Goals - 07/06/21 0845       PT LONG TERM GOAL #1   Title Pt will be ind with advanced HEP and understand how to safely progress    Time 6    Period Weeks    Status New    Target Date 08/17/21      PT LONG TERM GOAL #2   Title Pt will improve FOTO score from 46% to at least 56% to demo improved function.    Time 6    Period Weeks    Status New    Target Date 08/17/21      PT LONG TERM GOAL #3   Title Pt will be able to tolerate seated computer work for at least 1 hour at a time with min or no pain.    Time 6    Period Weeks    Status New    Target Date 08/17/21      PT LONG TERM GOAL #4   Title Pt will achieve Rt grip strength within 5lb of Lt UE    Time 6    Period Weeks    Status New    Target Date 08/17/21      PT LONG TERM GOAL #5   Title Pt will demo improved Rt rotation to at least 65 deg to improve visibility with driving and scanning multiple computer monitors for work    Baseline 55    Time 6    Period Weeks    Status New    Target Date 08/17/21                   Plan - 07/16/21 [id number]     Clinical Impression Statement Pt presents to clinic reporting mild dull achy pain in posterior neck, reporting she felt relief after DN and traction at home helps pain. Pt session focused on stretching neck, posture exercises and manual at neck which pt report improved relief with at end of session. Pt continues to demonstrate tightness and pain in Rt upper trap and shoulder, tingling in Rt arm and hand with first two-three digits. Pt would benefit from  Pt to continue to address deficits.    Personal Factors and Comorbidities Time since onset of injury/illness/exacerbation    Examination-Activity Limitations Sit;Lift;Carry    Examination-Participation Restrictions Occupation;Driving;Cleaning    Stability/Clinical Decision Making  Stable/Uncomplicated    Clinical Decision Making Low    Rehab Potential Good    PT Frequency 2x / week    PT Duration 6 weeks    PT Treatment/Interventions ADLs/Self Care Home Management;Electrical Stimulation;Cryotherapy;Moist Heat;Traction;Neuromuscular re-education;Therapeutic exercise;Patient/family education;Manual techniques;Passive range of motion;Dry needling;Taping;Joint Manipulations;Spinal Manipulations;Therapeutic activities;Functional mobility training    PT Next Visit Plan start with soft foam roller lying vertically along spine for decompression x 3', review HEP and f/u on DN #2, trial of cervical mechanical traction 12-15lb in slight flexion, add yellow band row and try pec doorway stretch    PT Home Exercise Plan Access Code: [email or web address] and DN info    Consulted and Agree with Plan of Care Patient             Patient will benefit from skilled therapeutic intervention in order to improve the following deficits and impairments:  Decreased range of motion, Increased fascial restricitons, Impaired UE functional use, Increased muscle spasms, Decreased activity tolerance, Pain, Hypomobility, Impaired flexibility, Decreased strength  Visit Diagnosis: Muscle weakness (generalized)  Abnormal posture  Cramp and spasm     Problem List Patient Active Problem List   Diagnosis Date Noted   Vitamin D deficiency 01/09/2019   Dysphagia    Laryngopharyngeal reflux 09/23/2014   Tobacco abuse 10/01/2013   Incisional hernia - periumbilical [id number]   PVC's (premature ventricular contractions) 09/14/2013    Stacy Gardner, PT 08/04/229:41 AM   [facility] [address], [place], [place], [address] Phone: [phone]   Fax:  [phone]  Name: Shelly Blackwell MRN: [id number] Date of Birth: 1971-08-12

## 2021-07-17 ENCOUNTER — Encounter: Payer: Self-pay | Admitting: Physical Therapy

## 2021-07-17 DIAGNOSIS — Z6832 Body mass index (BMI) 32.0-32.9, adult: Secondary | ICD-10-CM | POA: Insufficient documentation

## 2021-07-20 ENCOUNTER — Ambulatory Visit: Payer: No Typology Code available for payment source | Admitting: Physical Therapy

## 2021-07-20 ENCOUNTER — Encounter: Payer: Self-pay | Admitting: Physical Therapy

## 2021-07-22 ENCOUNTER — Encounter: Payer: Self-pay | Admitting: Physical Therapy

## 2021-07-22 ENCOUNTER — Other Ambulatory Visit: Payer: Self-pay

## 2021-07-22 ENCOUNTER — Ambulatory Visit: Payer: No Typology Code available for payment source | Admitting: Physical Therapy

## 2021-07-22 DIAGNOSIS — R252 Cramp and spasm: Secondary | ICD-10-CM

## 2021-07-22 DIAGNOSIS — M5412 Radiculopathy, cervical region: Secondary | ICD-10-CM

## 2021-07-22 DIAGNOSIS — M6281 Muscle weakness (generalized): Secondary | ICD-10-CM

## 2021-07-22 DIAGNOSIS — R293 Abnormal posture: Secondary | ICD-10-CM

## 2021-07-22 NOTE — Therapy (Signed)
[facility] [address] [place], [place], [address] Phone: [phone]   Fax:  [phone]  Physical Therapy Treatment  Patient Details  Name: Shelly Blackwell MRN: [id number] Date of Birth: December 28, 1970 Referring Provider (PT): Consuella Lose, MD   Encounter Date: 07/22/2021   PT End of Session - 07/22/21 0815     Visit Number 5    Date for PT Re-Evaluation 08/17/21    Authorization Type [employer]    Authorization - Visit Number 5    Authorization - Number of Visits 30    PT Start Time 0805    PT Stop Time 0845    PT Time Calculation (min) 40 min    Activity Tolerance Patient tolerated treatment well    Behavior During Therapy [facility] for tasks assessed/performed             Past Medical History:  Diagnosis Date   Depression    Gastritis    GERD (gastroesophageal reflux disease)    History of palpitations    Kidney stones    h/o-no surgery    Past Surgical History:  Procedure Laterality Date   AUGMENTATION MAMMAPLASTY Bilateral    2008   BREAST ENHANCEMENT SURGERY  2009   [place]   left foot   [place], 2001   x2   ESOPHAGEAL MANOMETRY N/A 09/19/2017   Procedure: ESOPHAGEAL MANOMETRY (EM);  Surgeon: Mauri Pole, MD;  [facility];  Service: Endoscopy;  Laterality: N/A;   LAPAROSCOPIC CHOLECYSTECTOMY  [id number]   UMBILICAL HERNIA REPAIR N/A 11/01/2013   Procedure: HERNIA REPAIR UMBILICAL ADULT;  Surgeon: Adin Hector, MD;  Location: [facility];  Service: General;  Laterality: N/A;    There were no vitals filed for this visit.   Subjective Assessment - 07/22/21 0805     Subjective Injection was attempted but MD could not get into the area due to the bone spurs.  I have an appointment with the surgeon (Dr. Vertell Limber) on 08/05/21.  I am really sore.  I am tight and sore and my Rt hand is going nuts with pins/needles.    Pertinent History Pt has NCV 07/27/21, ESI  scheduled for 8/5    How long can you sit comfortably? 20 min, uses a kneeling chair for work, uses 3 computer screens for work    Diagnostic tests M47.22 (ICD-10-CM) - Other spondylosis with radiculopathy, cervical region  cervical MRI 06/14/21: Rt sided disc osteophyte and lig flavum thickening, mod foraminal stenosis, pronounced Rt C5/6 facet degenerative disease    Patient Stated Goals relief, learn HEP    Currently in Pain? Yes    Pain Score 2    has been up to an 8/10 end of day at work   Pain Location Neck    Pain Orientation Lower;Mid;Left;Right    Pain Descriptors / Indicators Sore;Tightness;Dull;Pins and needles    Pain Type Acute pain;Chronic pain    Pain Radiating Towards Rt hand pins/needles    Pain Onset More than a month ago    Pain Frequency Constant    Aggravating Factors  computer work, end of work day 8/10    Pain Relieving Factors neck traction, laying down    Effect of Pain on Daily Activities pain with work                [facility] PT Assessment - 07/22/21 0001       Assessment   Medical Diagnosis M47.22 (ICD-10-CM) -  Other spondylosis with radiculopathy, cervical region    Referring Provider (PT) Consuella Lose, MD    Onset Date/Surgical Date --   10 years ago, worse into Rt UE last 6 mos   Hand Dominance Right    Next MD Visit 07/17/21   ESI   Prior Therapy chirco care                           [facility] Adult PT Treatment/Exercise - 07/22/21 0001       Self-Care   Self-Care Other Self-Care Comments    Other Self-Care Comments  review of spinal model and correlating to imaging findings per Pt's request      Exercises   Exercises Neck;Shoulder;Hand      Neck Exercises: Seated   Neck Retraction 10 reps;5 secs    Neck Retraction Limitations with towel resistance      Neck Exercises: Supine   Cervical Isometrics Right lateral flexion;Left lateral flexion;Right rotation;Left rotation;5 secs;3 secs;5 reps    Other Supine Exercise foam  roller vertical lying x 1'      Neck Exercises: Sidelying   Other Sidelying Exercise open books x 5 each way knee on foam roller      Shoulder Exercises: Supine   Horizontal ABduction Strengthening;10 reps;Theraband    Theraband Level (Shoulder Horizontal ABduction) Level 2 (Red)    Horizontal ABduction Limitations on soft foam roller    External Rotation Strengthening;Both;10 reps;Theraband    Theraband Level (Shoulder External Rotation) Level 2 (Red)    External Rotation Limitations on soft foam roller      Hand Exercises   Digiticizer 1.4kg Rt hand x 2' seated      Modalities   Modalities Traction      Traction   Type of Traction Cervical    Min (lbs) 12    Max (lbs) 20    Hold Time 60    Rest Time 20    Time 10                      PT Short Term Goals - 07/22/21 0843       PT SHORT TERM GOAL #1   Title Pt ind with initial HEP    Status Achieved      PT SHORT TERM GOAL #2   Title Pt will be able to tolerate at least 30 min of computer work with min pain    Baseline uses HEP tools and postural awareness to manage pain at work    Status Achieved      PT SHORT TERM GOAL #3   Title Improve Rt rotation to at least 60 deg with min arm and neck pain    Status On-going      PT SHORT TERM GOAL #4   Title Pt will report at least 25% reduction of pain during work day.    Status On-going               PT Long Term Goals - 07/06/21 0845       PT LONG TERM GOAL #1   Title Pt will be ind with advanced HEP and understand how to safely progress    Time 6    Period Weeks    Status New    Target Date 08/17/21      PT LONG TERM GOAL #2   Title Pt will improve FOTO score from 46% to at least 56% to demo  improved function.    Time 6    Period Weeks    Status New    Target Date 08/17/21      PT LONG TERM GOAL #3   Title Pt will be able to tolerate seated computer work for at least 1 hour at a time with min or no pain.    Time 6    Period Weeks     Status New    Target Date 08/17/21      PT LONG TERM GOAL #4   Title Pt will achieve Rt grip strength within 5lb of Lt UE    Time 6    Period Weeks    Status New    Target Date 08/17/21      PT LONG TERM GOAL #5   Title Pt will demo improved Rt rotation to at least 65 deg to improve visibility with driving and scanning multiple computer monitors for work    Baseline 55    Time 6    Period Weeks    Status New    Target Date 08/17/21                   Plan - 07/22/21 0817     Clinical Impression Statement Pt continues to have pain ranging from 2-8/10 with pain being worse at the end of the work day.  MD was unable to get past bone spurs for ESI success last weekend.  Pt arrived very sore with signif increase in pins/needles into Rt UE.  PT trialed mechanical traction today with immediate relief of Rt UE symptoms and neck pain.  Symptoms remained absent or very mild into Rt UE following traction for remaining PT session (traction done at beginning).  PT reviewed spinal anatomy correlating with imaging findings today per Pt request.  Pt purchased soft foam roller so PT reviewed thoracic mobilization, spinal decompression and ther ex on foam roller today for HEP.  Pt seeing neurosurgeon on 8/24 and getting NCV test next week.  Conitnue along POC for ongoing pain management, postural re-ed, and cervical and Rt UE strengthening.    PT Frequency 2x / week    PT Duration 6 weeks    PT Treatment/Interventions ADLs/Self Care Home Management;Electrical Stimulation;Cryotherapy;Moist Heat;Traction;Neuromuscular re-education;Therapeutic exercise;Patient/family education;Manual techniques;Passive range of motion;Dry needling;Taping;Joint Manipulations;Spinal Manipulations;Therapeutic activities;Functional mobility training    PT Next Visit Plan continue mechanical traction, f/u on NCV test and meeting with surgeon (8/24), continue modalities and postural re-ed/strenght, manual techniques as needed  incl DN    PT Home Exercise Plan Access Code: [email or web address] and DN info    Consulted and Agree with Plan of Care Patient             Patient will benefit from skilled therapeutic intervention in order to improve the following deficits and impairments:     Visit Diagnosis: Radiculopathy, cervical region  Muscle weakness (generalized)  Abnormal posture  Cramp and spasm     Problem List Patient Active Problem List   Diagnosis Date Noted   Vitamin D deficiency 01/09/2019   Dysphagia    Laryngopharyngeal reflux 09/23/2014   Tobacco abuse 10/01/2013   Incisional hernia - periumbilical [id number]   PVC's (premature ventricular contractions) 09/14/2013   Corynne Scibilia, PT 07/22/21 8:48 AM   [facility] [address], [place], [place], [address] Phone: [phone]   Fax:  [phone]  Name: Shelly Blackwell MRN: [id number] Date of Birth: 08-08-1971

## 2021-07-24 ENCOUNTER — Encounter: Payer: No Typology Code available for payment source | Admitting: Physical Therapy

## 2021-07-24 ENCOUNTER — Encounter: Payer: Self-pay | Admitting: Physical Therapy

## 2021-07-28 ENCOUNTER — Ambulatory Visit: Payer: No Typology Code available for payment source | Admitting: Physical Therapy

## 2021-07-28 ENCOUNTER — Other Ambulatory Visit: Payer: Self-pay

## 2021-07-28 DIAGNOSIS — M5412 Radiculopathy, cervical region: Secondary | ICD-10-CM

## 2021-07-28 DIAGNOSIS — R252 Cramp and spasm: Secondary | ICD-10-CM

## 2021-07-28 DIAGNOSIS — M542 Cervicalgia: Secondary | ICD-10-CM

## 2021-07-28 DIAGNOSIS — M6281 Muscle weakness (generalized): Secondary | ICD-10-CM

## 2021-07-28 DIAGNOSIS — R293 Abnormal posture: Secondary | ICD-10-CM

## 2021-07-28 NOTE — Therapy (Addendum)
[facility] [address] [place], [place], [address] Phone: [phone]   Fax:  [phone]  Physical Therapy Treatment  Patient Details  Name: Shelly Blackwell MRN: [id number] Date of Birth: [birth year] Referring Provider (PT): Consuella Lose, MD   Encounter Date: 07/28/2021   PT End of Session - 07/28/21 0841     Visit Number 6    Date for PT Re-Evaluation 08/17/21    Authorization Type [employer]    Authorization - Visit Number 6    Authorization - Number of Visits 30    PT Start Time 0805    PT Stop Time 0848    PT Time Calculation (min) 43 min    Activity Tolerance Patient tolerated treatment well    Behavior During Therapy [facility] for tasks assessed/performed             Past Medical History:  Diagnosis Date   Depression    Gastritis    GERD (gastroesophageal reflux disease)    History of palpitations    Kidney stones    h/o-no surgery    Past Surgical History:  Procedure Laterality Date   AUGMENTATION MAMMAPLASTY Bilateral    2008   BREAST ENHANCEMENT SURGERY  2009   [place]   left foot   [place], 2001   x2   ESOPHAGEAL MANOMETRY N/A 09/19/2017   Procedure: ESOPHAGEAL MANOMETRY (EM);  Surgeon: Mauri Pole, MD;  [facility];  Service: Endoscopy;  Laterality: N/A;   LAPAROSCOPIC CHOLECYSTECTOMY  7106   UMBILICAL HERNIA REPAIR N/A 11/01/2013   Procedure: HERNIA REPAIR UMBILICAL ADULT;  Surgeon: Adin Hector, MD;  Location: [facility];  Service: General;  Laterality: N/A;    There were no vitals filed for this visit.   Subjective Assessment - 07/28/21 0804     Subjective Continues to have symptom radiation distally into fingers of Rt UE. Has had more headaches. Had nerve conduction study which ruled out Carpal Tunnel but did determine that nerve damage has not occured.    Pertinent History Pt has NCV 07/27/21, ESI scheduled for 8/5    Limitations  Sitting    How long can you sit comfortably? 20 min, uses a kneeling chair for work, uses 3 computer screens for work    Diagnostic tests M47.22 (ICD-10-CM) - Other spondylosis with radiculopathy, cervical region  cervical MRI 06/14/21: Rt sided disc osteophyte and lig flavum thickening, mod foraminal stenosis, pronounced Rt C5/6 facet degenerative disease    Patient Stated Goals relief, learn HEP    Currently in Pain? Yes    Pain Score 2     Pain Location Neck    Pain Orientation Right;Left    Pain Descriptors / Indicators Tightness;Sore;Dull;Pins and needles    Pain Onset More than a month ago                               [facility] Adult PT Treatment/Exercise - 07/28/21 0001       Self-Care   Self-Care --    Other Self-Care Comments  --      Exercises   Exercises Neck;Shoulder;Hand      Neck Exercises: Seated   Neck Retraction 10 reps;5 secs    Neck Retraction Limitations with towel resistance      Neck Exercises: Supine   Cervical Isometrics Right lateral flexion;Left lateral flexion;Right rotation;Left rotation;5 secs;3 secs;5 reps  Other Supine Exercise foam roller vertical lying; horizontal abduction at 90/90 x10 repetitions      Neck Exercises: Sidelying   Other Sidelying Exercise open books x 5 each way knee on foam roller      Shoulder Exercises: Supine   Horizontal ABduction Strengthening;Theraband;12 reps    Theraband Level (Shoulder Horizontal ABduction) Level 2 (Red)    Horizontal ABduction Limitations on soft foam roller    External Rotation Strengthening;Both;Theraband;12 reps    Theraband Level (Shoulder External Rotation) Level 2 (Red)    External Rotation Limitations on soft foam roller      Hand Exercises   Digiticizer --      Modalities   Modalities Traction      Traction   Type of Traction Cervical    Min (lbs) 12    Max (lbs) 20    Hold Time 60    Rest Time 20    Time 10                      PT Short Term  Goals - 07/22/21 0843       PT SHORT TERM GOAL #1   Title Pt ind with initial HEP    Status Achieved      PT SHORT TERM GOAL #2   Title Pt will be able to tolerate at least 30 min of computer work with min pain    Baseline uses HEP tools and postural awareness to manage pain at work    Status Achieved      PT SHORT TERM GOAL #3   Title Improve Rt rotation to at least 60 deg with min arm and neck pain    Status On-going      PT SHORT TERM GOAL #4   Title Pt will report at least 25% reduction of pain during work day.    Status On-going               PT Long Term Goals - 07/06/21 0845       PT LONG TERM GOAL #1   Title Pt will be ind with advanced HEP and understand how to safely progress    Time 6    Period Weeks    Status New    Target Date 08/17/21      PT LONG TERM GOAL #2   Title Pt will improve FOTO score from 46% to at least 56% to demo improved function.    Time 6    Period Weeks    Status New    Target Date 08/17/21      PT LONG TERM GOAL #3   Title Pt will be able to tolerate seated computer work for at least 1 hour at a time with min or no pain.    Time 6    Period Weeks    Status New    Target Date 08/17/21      PT LONG TERM GOAL #4   Title Pt will achieve Rt grip strength within 5lb of Lt UE    Time 6    Period Weeks    Status New    Target Date 08/17/21      PT LONG TERM GOAL #5   Title Pt will demo improved Rt rotation to at least 65 deg to improve visibility with driving and scanning multiple computer monitors for work    Baseline 55    Time 6    Period Weeks    Status New  Target Date 08/17/21                   Plan - 07/28/21 0836     Clinical Impression Statement Patient reporting that pain continues to be best in the morning. Continues to be compliant with HEP. Intermittent cuing for decreased scapular elevation with open books and horizontal abduction at 90/90. Patient reported some occipital soreness following  mechanical traction last session but overall would like to continue with this intervention thus traction used again this date. Would benefit from continued skilled intervention to address impairments along current POC.    Personal Factors and Comorbidities Time since onset of injury/illness/exacerbation    Examination-Activity Limitations Sit;Lift;Carry    Examination-Participation Restrictions Occupation;Driving;Cleaning    Rehab Potential Good    PT Frequency 2x / week    PT Duration 6 weeks    PT Treatment/Interventions ADLs/Self Care Home Management;Electrical Stimulation;Cryotherapy;Moist Heat;Traction;Neuromuscular re-education;Therapeutic exercise;Patient/family education;Manual techniques;Passive range of motion;Dry needling;Taping;Joint Manipulations;Spinal Manipulations;Therapeutic activities;Functional mobility training    PT Next Visit Plan f/u mechanical traction #2; continue modalities and postural re-ed/strengthening with progressions as needed; DN possibly going forward    PT Home Exercise Plan Access Code: [email or web address] and DN info    Consulted and Agree with Plan of Care Patient             Patient will benefit from skilled therapeutic intervention in order to improve the following deficits and impairments:  Decreased range of motion, Increased fascial restricitons, Impaired UE functional use, Increased muscle spasms, Decreased activity tolerance, Pain, Hypomobility, Impaired flexibility, Decreased strength  Visit Diagnosis: Muscle weakness (generalized)  Abnormal posture  Radiculopathy, cervical region  Cramp and spasm  Cervicalgia     Problem List Patient Active Problem List   Diagnosis Date Noted   Vitamin D deficiency 01/09/2019   Dysphagia    Laryngopharyngeal reflux 09/23/2014   Tobacco abuse 10/01/2013   Incisional hernia - periumbilical 92/42/6834   PVC's (premature ventricular contractions) 09/14/2013    Zylie Mumaw PT, DPT  07/28/21 8:57  AM  PHYSICAL THERAPY DISCHARGE SUMMARY  Visits from Start of Care: 6  Current functional level related to goals / functional outcomes: Pt goals not met.  Pt having surgery.   Remaining deficits: Weakness, pain, ROM deficits.   Education / Equipment: HEP  Patient agrees to discharge. Patient goals were not met. Patient is being discharged due to a change in medical status.  Venetia Night Beuhring, PT 08/11/21 11:16 AM  [place] [facility] [address] [place], [place], [address] Phone: [phone]   Fax:  [phone]  Name: Novalee Biegel MRN: [id number] Date of Birth: 06-Jul-1971

## 2021-07-31 ENCOUNTER — Other Ambulatory Visit: Payer: Self-pay

## 2021-07-31 ENCOUNTER — Emergency Department
Admission: EM | Admit: 2021-07-31 | Discharge: 2021-07-31 | Disposition: A | Discharge status: Home / Self Care | Payer: No Typology Code available for payment source | Source: Home / Self Care | Attending: Family Medicine | Admitting: Family Medicine

## 2021-07-31 ENCOUNTER — Emergency Department (INDEPENDENT_AMBULATORY_CARE_PROVIDER_SITE_OTHER): Payer: No Typology Code available for payment source

## 2021-07-31 ENCOUNTER — Encounter: Payer: Self-pay | Admitting: Emergency Medicine

## 2021-07-31 DIAGNOSIS — S92501A Displaced unspecified fracture of right lesser toe(s), initial encounter for closed fracture: Secondary | ICD-10-CM

## 2021-07-31 DIAGNOSIS — M79674 Pain in right toe(s): Secondary | ICD-10-CM

## 2021-07-31 DIAGNOSIS — W2203XA Walked into furniture, initial encounter: Secondary | ICD-10-CM

## 2021-07-31 NOTE — ED Provider Notes (Signed)
[facility]    CSN: [id number] Arrival date & time: 07/31/21  1242      History   Chief Complaint Chief Complaint  Patient presents with   Toe Injury    HPI Shelly Blackwell is a 50 y.o. female.   Patient bumped her right fifth toe against the leg of furniture last night, resulting in persistent pain/swelling.  She also reports that she recently had a minor "hairline" fracture of her right tibia of unknown cause, now healed.  The history is provided by the patient.  Toe Pain This is a new problem. Episode onset: last night. The problem occurs constantly. The problem has not changed since onset.The symptoms are aggravated by walking. Nothing relieves the symptoms. Treatments tried: ice pack. The treatment provided no relief.   Past Medical History:  Diagnosis Date   Depression    Gastritis    GERD (gastroesophageal reflux disease)    History of palpitations    Kidney stones    h/o-no surgery    Patient Active Problem List   Diagnosis Date Noted   Vitamin D deficiency 01/09/2019   Dysphagia    Laryngopharyngeal reflux 09/23/2014   Tobacco abuse 10/01/2013   Incisional hernia - periumbilical 99991111   PVC's (premature ventricular contractions) 09/14/2013    Past Surgical History:  Procedure Laterality Date   AUGMENTATION MAMMAPLASTY Bilateral    2008   BREAST ENHANCEMENT SURGERY  2009   BUNIONECTOMY  1989, 1998   left foot   [place], 2001   x2   ESOPHAGEAL MANOMETRY N/A 09/19/2017   Procedure: ESOPHAGEAL MANOMETRY (EM);  Surgeon: Mauri Pole, MD;  [facility];  Service: Endoscopy;  Laterality: N/A;   LAPAROSCOPIC CHOLECYSTECTOMY  [id number]   UMBILICAL HERNIA REPAIR N/A 11/01/2013   Procedure: HERNIA REPAIR UMBILICAL ADULT;  Surgeon: Adin Hector, MD;  [facility];  Service: General;  Laterality: N/A;    OB History     Gravida  2   Para  2   Term  2   Preterm      AB      Living  2      SAB       IAB      Ectopic      Multiple      Live Births               Home Medications    Prior to Admission medications   Medication Sig Start Date End Date Taking? Authorizing Provider  citalopram (CELEXA) 20 MG tablet Take one tablet (20 mg) by mouth daily. 10/16/20   Salvadore Dom, MD  ibuprofen (ADVIL,MOTRIN) 400 MG tablet Take 1-2 tablets (400-800 mg total) by mouth every 6 (six) hours as needed for fever, headache, mild pain, moderate pain or cramping. 11/01/13   Michael [place], MD  Multiple Vitamins-Minerals (MULTIVITAMIN PO) Take by mouth daily.    [provider]  Omega-3 Fatty Acids (FISH OIL) 1000 MG CAPS Take by mouth.    [provider]  Probiotic Product (PROBIOTIC-10 PO) Take by mouth.    [provider]    Family History Family History  Problem Relation Age of Onset   Hepatitis Mother    Liver cancer Mother    Cirrhosis Mother        liver transplant   Kidney Stones Mother    Lung cancer Father 35       brain metastasis, being treated at [facility]  Esophageal cancer Paternal Uncle    Alcoholism Maternal Grandfather    Stomach cancer Paternal Grandmother            Alcoholism Paternal Grandfather    Breast cancer Neg Hx    Colon cancer Neg Hx    Rectal cancer Neg Hx     Social History Social History   Tobacco Use   Smoking status: Former    Packs/day: 0.50    Years: 20.00    Pack years: 10.00    Types: Cigarettes    Quit date: 08/08/2017    Years since quitting: 3.9   Smokeless tobacco: Never   Tobacco comments:    quit 07/2017, but uses e cig  Vaping Use   Vaping Use: Every day   Devices: occasionally  Substance Use Topics   Alcohol use: No    Alcohol/week: 0.0 standard drinks   Drug use: No     Allergies   Nickel   Review of Systems Review of Systems  Musculoskeletal:  Positive for joint swelling.  Skin:  Positive for color change.  All other systems reviewed and are negative.   Physical  Exam Triage Vital Signs ED Triage Vitals  Enc Vitals Group     BP 07/31/21 1256 136/85     Pulse Rate 07/31/21 1256 68     Resp 07/31/21 1256 18     Temp 07/31/21 1256 98 F (36.7 C)     Temp Source 07/31/21 1256 Oral     SpO2 07/31/21 1256 99 %     Weight 07/31/21 1257 195 lb (88.5 kg)     Height 07/31/21 1257 '5\' 7"'$  (1.702 m)     Head Circumference --      Peak Flow --      Pain Score 07/31/21 1257 8     Pain Loc --      Pain Edu? --      Excl. in [place]? --    No data found.  Updated Vital Signs BP 136/85 (BP Location: Left Arm)   Pulse 68   Temp 98 F (36.7 C) (Oral)   Resp 18   Ht '5\' 7"'$  (1.702 m)   Wt 88.5 kg   LMP 02/11/2019   SpO2 99%   BMI 30.54 kg/m   Visual Acuity Right Eye Distance:   Left Eye Distance:   Bilateral Distance:    Right Eye Near:   Left Eye Near:    Bilateral Near:     Physical Exam Constitutional:      General: She is not in acute distress. HENT:     Head: Atraumatic.  Eyes:     Pupils: Pupils are equal, round, and reactive to light.  Cardiovascular:     Rate and Rhythm: Normal rate.  Pulmonary:     Effort: Pulmonary effort is normal.  Musculoskeletal:     Right foot: Decreased range of motion. Normal capillary refill. Swelling, tenderness and bony tenderness present.       Feet:     Comments: Right fifth toe is swollen with tenderness to palpation over MTP joint and proximal phalanx.  Ecchymosis present.  Cap refill intact.  Skin:    General: Skin is warm and dry.  Neurological:     General: No focal deficit present.     Mental Status: She is alert.     UC Treatments / Results  Labs (all labs ordered are listed, but only abnormal results are displayed) Labs Reviewed - No data to display  EKG   Radiology No results found.  Procedures Procedures (including critical care time)  Medications Ordered in UC Medications - No data to display  Initial Impression / Assessment and Plan / UC Course  I have reviewed the  triage vital signs and the nursing notes.  Pertinent labs & imaging results that were available during my care of the patient were reviewed by me and considered in my medical decision making (see chart for details).    Discussed with Dr. Aundria Mems. Toe strapped using "Buddy Tape" technique.  Dispensed post-op shoe. Followup with podiatrist for management. Because of recent history of  minor fracture right tibia, recommend followup with PCP for bone density and vitamin D testing.  Final Clinical Impressions(s) / UC Diagnoses   Final diagnoses:  Fracture of fifth toe, right, closed, initial encounter     Discharge Instructions      Buddy tape toes.  Wear post-op shoe.  Elevate foot when possible.  Apply ice pack for 15 to 20 minutes, 3 to 4 times daily  Continue until pain and swelling decrease.  May take Tylenol as needed for pain.     ED Prescriptions   None       Kandra Nicolas, MD 08/01/21 1747

## 2021-07-31 NOTE — Discharge Instructions (Addendum)
Buddy tape toes.  Wear post-op shoe.  Elevate foot when possible.  Apply ice pack for 15 to 20 minutes, 3 to 4 times daily  Continue until pain and swelling decrease.  May take Tylenol as needed for pain.

## 2021-07-31 NOTE — ED Triage Notes (Signed)
Rt fifth toe, ran into a piece of furniture last night, bruised and swollen

## 2021-08-04 ENCOUNTER — Ambulatory Visit (INDEPENDENT_AMBULATORY_CARE_PROVIDER_SITE_OTHER): Payer: No Typology Code available for payment source

## 2021-08-04 ENCOUNTER — Ambulatory Visit: Payer: No Typology Code available for payment source | Admitting: Podiatry

## 2021-08-04 ENCOUNTER — Other Ambulatory Visit: Payer: Self-pay

## 2021-08-04 VITALS — BP 132/71 | HR 63 | Temp 97.9°F

## 2021-08-04 DIAGNOSIS — S92911A Unspecified fracture of right toe(s), initial encounter for closed fracture: Secondary | ICD-10-CM | POA: Diagnosis not present

## 2021-08-04 DIAGNOSIS — S92401A Displaced unspecified fracture of right great toe, initial encounter for closed fracture: Secondary | ICD-10-CM | POA: Diagnosis not present

## 2021-08-05 DIAGNOSIS — M5020 Other cervical disc displacement, unspecified cervical region: Secondary | ICD-10-CM | POA: Insufficient documentation

## 2021-08-06 ENCOUNTER — Ambulatory Visit: Payer: No Typology Code available for payment source | Admitting: Podiatry

## 2021-08-10 ENCOUNTER — Telehealth: Payer: Self-pay | Admitting: *Deleted

## 2021-08-10 NOTE — Telephone Encounter (Signed)
Patient called to follow up on her foot, she fractured it and Dr. Jacqualyn Posey put it back in place but she is having more pain than before., pt wanted to know if she should come in

## 2021-08-10 NOTE — Telephone Encounter (Signed)
Patient is calling because her foot is more swollen than usual, red and hurting. Upcoming appointment on 08/18/21.Please advise.

## 2021-08-11 ENCOUNTER — Ambulatory Visit: Payer: No Typology Code available for payment source | Admitting: Podiatry

## 2021-08-11 ENCOUNTER — Ambulatory Visit (INDEPENDENT_AMBULATORY_CARE_PROVIDER_SITE_OTHER): Payer: No Typology Code available for payment source

## 2021-08-11 ENCOUNTER — Other Ambulatory Visit: Payer: Self-pay

## 2021-08-11 ENCOUNTER — Other Ambulatory Visit: Payer: Self-pay | Admitting: Podiatry

## 2021-08-11 ENCOUNTER — Telehealth: Payer: Self-pay | Admitting: *Deleted

## 2021-08-11 DIAGNOSIS — S92531D Displaced fracture of distal phalanx of right lesser toe(s), subsequent encounter for fracture with routine healing: Secondary | ICD-10-CM

## 2021-08-11 DIAGNOSIS — S99921A Unspecified injury of right foot, initial encounter: Secondary | ICD-10-CM | POA: Diagnosis not present

## 2021-08-11 DIAGNOSIS — M25571 Pain in right ankle and joints of right foot: Secondary | ICD-10-CM

## 2021-08-11 NOTE — Telephone Encounter (Signed)
Patient was seen today by Dr Jacqualyn Posey.

## 2021-08-11 NOTE — Progress Notes (Signed)
Subjective: 50 year old female presents the office today for evaluation status post fifth toe fracture.  She presents today as she had increased pain yesterday despite taping.  She is been using the surgical shoe.  No recent injury falls or changes otherwise since I last saw her.  She is concerned about the bone strength, healing. Denies any systemic complaints such as fevers, chills, nausea, vomiting. No acute changes since last appointment, and no other complaints at this time.   Objective: AAO x3, NAD DP/PT pulses palpable bilaterally, CRT less than 3 seconds There is still mild edema to the toe heart does appear to be actually improved compared to what it was previously.  There is no open lesions.  Mild bruising.  Toe clinically sits in rectus position although adductovarus foot about similar compared to the left side.  No pain with calf compression, swelling, warmth, erythema  Assessment: Fifth toe fracture  Plan: -All treatment options discussed with the patient including all alternatives, risks, complications.  -Repeat x-rays obtained reviewed.  Does not appear that the fracture has moved compared to when I saw her last appointment. -We discussed both conservative as well as surgical care.  At this point is on her heels in this position hopefully she will not have any issues long-term.  She is concerned about the bone strength.  Vitamin D, CMP, magnesium blood work.  For now continue surgical shoe.  Ice elevation. -Patient encouraged to call the office with any questions, concerns, change in symptoms.   Trula Slade DPM

## 2021-08-11 NOTE — Progress Notes (Signed)
Subjective:   Patient ID: Shelly Blackwell, female   DOB: 50 y.o.   MRN: [id number]   HPI 50 year old female presents the office today for concerns of a right fifth toe fracture.  She was [facility] urgent care and was told it was displaced.  She presents today for follow evaluation.  She said this started on 3 AM on Friday going to the bathroom and she hit her toe on a piece of furniture.  Has been wearing surgical shoe which has been helping the event discomfort is also been improving.  No other injury.   Review of Systems  All other systems reviewed and are negative.  Past Medical History:  Diagnosis Date   Depression    Gastritis    GERD (gastroesophageal reflux disease)    History of palpitations    Kidney stones    h/o-no surgery    Past Surgical History:  Procedure Laterality Date   AUGMENTATION MAMMAPLASTY Bilateral    2008   BREAST ENHANCEMENT SURGERY  2009   BUNIONECTOMY  1989, 1998   left foot   [place], 2001   x2   ESOPHAGEAL MANOMETRY N/A 09/19/2017   Procedure: ESOPHAGEAL MANOMETRY (EM);  Surgeon: Mauri Pole, MD;  Location: [facility];  Service: Endoscopy;  Laterality: N/A;   LAPAROSCOPIC CHOLECYSTECTOMY  [id number]   UMBILICAL HERNIA REPAIR N/A 11/01/2013   Procedure: HERNIA REPAIR UMBILICAL ADULT;  Surgeon: Adin Hector, MD;  Location: [facility];  Service: General;  Laterality: N/A;     Current Outpatient Medications:    citalopram (CELEXA) 20 MG tablet, Take one tablet (20 mg) by mouth daily., Disp: 90 tablet, Rfl: 3   ibuprofen (ADVIL,MOTRIN) 400 MG tablet, Take 1-2 tablets (400-800 mg total) by mouth every 6 (six) hours as needed for fever, headache, mild pain, moderate pain or cramping., Disp: 40 tablet, Rfl: 1   Multiple Vitamins-Minerals (MULTIVITAMIN PO), Take by mouth daily., Disp: , Rfl:    Omega-3 Fatty Acids (FISH OIL) 1000 MG CAPS, Take by mouth., Disp: , Rfl:    Probiotic Product (PROBIOTIC-10 PO), Take by mouth., Disp: , Rfl:    Allergies  Allergen Reactions   Nickel Rash    Skin reaction with contact to nickel          Objective:  Physical Exam  General: AAO x3, NAD  Dermatological: There is mild edema to the right fifth toe.  There is mild bruising present but there is no skin lesions or open sores.  Vascular: Dorsalis Pedis artery and Posterior Tibial artery pedal pulses are 2/4 bilateral with immedate capillary fill time.  There is no pain with calf compression, swelling, warmth, erythema.   Neruologic: Grossly intact via light touch bilateral.   Musculoskeletal: Adductovarus noted to the fifth toe.  Toe appears to be rectus in nonweightbearing position.  She 7 she does walk the fifth toe does leaning outwards points to the lateral direction.  Muscular strength 5/5 in all groups tested bilateral.  Gait: Unassisted, Nonantalgic.       Assessment:   Displaced right fifth toe fracture     Plan:  -Treatment options discussed including all alternatives, risks, and complications -Etiology of symptoms were discussed -Independently reviewed the x-rays from the urgent care.  Displacement of left proximal phalanx with lateral angulation present.  I discussed with her attempted closed adduction of the toe including risks and complications associate with this.  Consent was signed and she wishes to proceed.  The skin was prepped with  alcohol and 3 cc lidocaine, Marcaine plain was infiltrated in a digital block fashion.  Once anesthetized the skin was prepped with Betadine.  I attempted closed reduction of the fifth toe and the toe was splinted to the fourth digit.  Post reduction x-rays showed some mild improvement in the deformity.  We discussed with her continued conservative care versus surgical intervention.  At this point I think as long as the toe heals in this position it should not cause issues long-term but if it were to change position it would possibly need surgical intervention for open reduction  internal fixation.  Trula Slade DPM

## 2021-08-12 ENCOUNTER — Ambulatory Visit: Payer: No Typology Code available for payment source | Admitting: Physical Therapy

## 2021-08-18 ENCOUNTER — Ambulatory Visit: Payer: No Typology Code available for payment source | Admitting: Podiatry

## 2021-08-24 ENCOUNTER — Ambulatory Visit: Payer: No Typology Code available for payment source | Admitting: Podiatry

## 2021-08-26 ENCOUNTER — Encounter: Payer: Self-pay | Admitting: Podiatry

## 2021-09-04 LAB — COMPLETE METABOLIC PANEL WITH GFR
AG Ratio: 1.7 (calc) (ref 1.0–2.5)
ALT: 27 U/L (ref 6–29)
AST: 21 U/L (ref 10–35)
Albumin: 4.5 g/dL (ref 3.6–5.1)
Alkaline phosphatase (APISO): 119 U/L (ref 37–153)
BUN: 12 mg/dL (ref 7–25)
CO2: 29 mmol/L (ref 20–32)
Calcium: 10 mg/dL (ref 8.6–10.4)
Chloride: 103 mmol/L (ref 98–110)
Creat: 0.56 mg/dL (ref 0.50–1.03)
Globulin: 2.6 g/dL (calc) (ref 1.9–3.7)
Glucose, Bld: 101 mg/dL (ref 65–139)
Potassium: 5 mmol/L (ref 3.5–5.3)
Sodium: 140 mmol/L (ref 135–146)
Total Bilirubin: 0.3 mg/dL (ref 0.2–1.2)
Total Protein: 7.1 g/dL (ref 6.1–8.1)
eGFR: 110 mL/min/{1.73_m2} (ref >=60)

## 2021-09-04 LAB — VITAMIN D 25 HYDROXY (VIT D DEFICIENCY, FRACTURES): Vit D, 25-Hydroxy: 29 ng/mL — ABNORMAL LOW (ref 30–100)

## 2021-09-04 LAB — MAGNESIUM: Magnesium: 2.1 mg/dL (ref 1.5–2.5)

## 2021-09-08 ENCOUNTER — Other Ambulatory Visit: Payer: Self-pay

## 2021-09-08 ENCOUNTER — Ambulatory Visit (INDEPENDENT_AMBULATORY_CARE_PROVIDER_SITE_OTHER): Payer: No Typology Code available for payment source | Admitting: Podiatry

## 2021-09-08 ENCOUNTER — Ambulatory Visit (INDEPENDENT_AMBULATORY_CARE_PROVIDER_SITE_OTHER): Payer: No Typology Code available for payment source

## 2021-09-08 DIAGNOSIS — S92531D Displaced fracture of distal phalanx of right lesser toe(s), subsequent encounter for fracture with routine healing: Secondary | ICD-10-CM

## 2021-09-11 NOTE — Progress Notes (Signed)
Subjective: 50 year old female presents the office today for evaluation status post fifth toe fracture.  She states that there has been improvement in the toe.  She said that some tenderness most underneath the toe but overall improved.  She is wearing regular sandals today.  No recent injury or changes otherwise since I last saw her.   Objective: AAO x3, NAD DP/PT pulses palpable bilaterally, CRT less than 3 seconds There is still mild edema to the toe however it continues to improve.  No significant erythema there is no open lesions.  The toe versus present toe but also present on the contralateral extremity.  Mild tenderness palpation was on plantar aspect the digit.  No other areas of discomfort of the foot.  Assessment: Fifth toe fracture-improving  Plan: -All treatment options discussed with the patient including all alternatives, risks, complications.  -Repeat x-rays obtained reviewed.  Fracture still noted on the proximal phalanx however callus formation is evident. -Continue wearing stiffer soled shoe and discussed this will limit activities until the pain resolves then she can gradually increase activity levels at that time. -She would like to get inserts and consider this next appointment.  Trula Slade DPM

## 2021-09-18 HISTORY — PX: CERVICAL DISC ARTHROPLASTY: SHX587

## 2021-10-20 ENCOUNTER — Ambulatory Visit: Payer: No Typology Code available for payment source | Admitting: Podiatry

## 2021-10-21 NOTE — Progress Notes (Signed)
50 y.o. G67P2002 Divorced White or Caucasian Not Hispanic or Latino female here for annual exam.  Patient states that she has been gaining weight.  No vaginal bleeding. She is having tolerable vasomotor symptoms, mostly having hot flashes in the morning. Doesn't get enough sleep, too busy with life. Sleeps okay.   She is under stress. Finances are tight, she hates her job. She is raising her 42 year old grandson, who she adores. Her mother is showing signs of memory loss. She is having trouble focusing.   Same partner for over a year. Live 2 hours apart. Sexually active, no pain.     Patient's last menstrual period was 02/11/2019.          Sexually active: Yes.    The current method of family planning is post menopausal status.    Exercising: No.   Patient has an active lifestyle  Smoker:  no  Health Maintenance: Pap:  12/23/16- WNL, HPV- neg, 05/22/13- WNL, HPV- neg  History of abnormal Pap:  no MMG:  11/13/20- birads 1 neg  BMD:   never  Colonoscopy: 06/03/21- f/u in 10 years  TDaP:  08/22/2017 Gardasil: n/a   reports that she quit smoking about 4 years ago. Her smoking use included cigarettes. She has a 10.00 pack-year smoking history. She has never used smokeless tobacco. She reports that she does not drink alcohol and does not use drugs. Working at [employer] job, Therapist, art, Press photographer.  2 grown daughters. Older daughter is in [place], bipolar. Younger daughter is a Administrator, arts at [employer], living in the dorm.   She adopted her grandson, almost 4. She also has a young granddaughter who lives with her Dad.  Past Medical History:  Diagnosis Date   Depression    Gastritis    GERD (gastroesophageal reflux disease)    History of palpitations    Kidney stones    h/o-no surgery    Past Surgical History:  Procedure Laterality Date   AUGMENTATION MAMMAPLASTY Bilateral    2008   BREAST ENHANCEMENT SURGERY  2009   BUNIONECTOMY  1989, 1998   left foot   CERVICAL [place] ARTHROPLASTY  09/18/2021    [place], 2001   x2   ESOPHAGEAL MANOMETRY N/A 09/19/2017   Procedure: ESOPHAGEAL MANOMETRY (EM);  Surgeon: Mauri Pole, MD;  [facility];  Service: Endoscopy;  Laterality: N/A;   LAPAROSCOPIC CHOLECYSTECTOMY  6314   UMBILICAL HERNIA REPAIR N/A 11/01/2013   Procedure: HERNIA REPAIR UMBILICAL ADULT;  Surgeon: Adin Hector, MD;  Location: [facility];  Service: General;  Laterality: N/A;    Current Outpatient Medications  Medication Sig Dispense Refill   acetaminophen (TYLENOL) 500 MG tablet      citalopram (CELEXA) 20 MG tablet Take one tablet (20 mg) by mouth daily. 90 tablet 3   ibuprofen (ADVIL,MOTRIN) 400 MG tablet Take 1-2 tablets (400-800 mg total) by mouth every 6 (six) hours as needed for fever, headache, mild pain, moderate pain or cramping. 40 tablet 1   Multiple Vitamins-Minerals (MULTIVITAMIN PO) Take by mouth daily.     Omega-3 Fatty Acids (FISH OIL) 1000 MG CAPS Take by mouth.     Probiotic Product (PROBIOTIC-10 PO) Take by mouth.     No current facility-administered medications for this visit.    Family History  Problem Relation Age of Onset   Hepatitis Mother    Liver cancer Mother    Cirrhosis Mother        liver transplant  Kidney Stones Mother    Lung cancer Father 48       brain metastasis, being [facility]   Esophageal cancer Paternal Uncle    Alcoholism Maternal Grandfather    Stomach cancer Paternal Grandmother            Alcoholism Paternal Grandfather    Breast cancer Neg Hx    Colon cancer Neg Hx    Rectal cancer Neg Hx     Review of Systems  Exam:   BP 110/80   Pulse 88   Ht 5\' 7"  (1.702 m)   Wt 209 lb (94.8 kg)   LMP 02/11/2019   SpO2 100%   BMI 32.73 kg/m   Weight change: @WEIGHTCHANGE @ Height:   Height: 5\' 7"  (170.2 cm)  Ht Readings from Last 3 Encounters:  10/28/21 5\' 7"  (1.702 m)  07/31/21 5\' 7"  (1.702 m)  06/03/21 5\' 7"  (1.702 m)    General appearance: alert, cooperative and appears  stated age Head: Normocephalic, without obvious abnormality, atraumatic Neck: no adenopathy, supple, symmetrical, trachea midline and thyroid normal to inspection and palpation Lungs: clear to auscultation bilaterally Cardiovascular: regular rate and rhythm Breasts: normal appearance, no masses or tenderness Abdomen: soft, non-tender; non distended,  no masses,  no organomegaly Extremities: extremities normal, atraumatic, no cyanosis or edema Skin: Skin color, texture, turgor normal. No rashes or lesions Lymph nodes: Cervical, supraclavicular, and axillary nodes normal. No abnormal inguinal nodes palpated Neurologic: Grossly normal   Pelvic: External genitalia:  no lesions              Urethra:  normal appearing urethra with no masses, tenderness or lesions              Bartholins and Skenes: normal                 Vagina: normal appearing vagina with normal color and discharge, no lesions              Cervix: no lesions               Bimanual Exam:  Uterus:  normal size, contour, position, consistency, mobility, non-tender              Adnexa: no mass, fullness, tenderness               Rectovaginal: Confirms               Anus:  normal sphincter tone, no lesions  Gae Dry chaperoned for the exam.  1. Well woman exam Discussed breast self exam Discussed calcium and vit D intake Mammogram next month Colonoscopy UTD  2. Screening for cervical cancer - Cytology - PAP  3. Other fatigue - TSH  4. Weight gain - TSH - Lipid panel  5. Laboratory exam ordered as part of routine general medical examination - CBC - Lipid panel  6. Dysthymia Overall controlled with medication - citalopram (CELEXA) 20 MG tablet; Take one tablet (20 mg) by mouth daily.  Dispense: 90 tablet; Refill: 3

## 2021-10-28 ENCOUNTER — Other Ambulatory Visit: Payer: Self-pay

## 2021-10-28 ENCOUNTER — Other Ambulatory Visit (HOSPITAL_COMMUNITY)
Admission: RE | Admit: 2021-10-28 | Discharge: 2021-10-28 | Disposition: A | Discharge status: Home / Self Care | Payer: No Typology Code available for payment source | Source: Ambulatory Visit | Attending: Obstetrics and Gynecology | Admitting: Obstetrics and Gynecology

## 2021-10-28 ENCOUNTER — Ambulatory Visit (INDEPENDENT_AMBULATORY_CARE_PROVIDER_SITE_OTHER): Payer: No Typology Code available for payment source | Admitting: Obstetrics and Gynecology

## 2021-10-28 ENCOUNTER — Encounter: Payer: Self-pay | Admitting: Obstetrics and Gynecology

## 2021-10-28 VITALS — BP 110/80 | HR 88 | Ht 67.0 in | Wt 209.0 lb

## 2021-10-28 DIAGNOSIS — R635 Abnormal weight gain: Secondary | ICD-10-CM | POA: Diagnosis not present

## 2021-10-28 DIAGNOSIS — Z0000 Encounter for general adult medical examination without abnormal findings: Secondary | ICD-10-CM

## 2021-10-28 DIAGNOSIS — Z124 Encounter for screening for malignant neoplasm of cervix: Secondary | ICD-10-CM

## 2021-10-28 DIAGNOSIS — F341 Dysthymic disorder: Secondary | ICD-10-CM

## 2021-10-28 DIAGNOSIS — Z01419 Encounter for gynecological examination (general) (routine) without abnormal findings: Secondary | ICD-10-CM | POA: Diagnosis not present

## 2021-10-28 DIAGNOSIS — R5383 Other fatigue: Secondary | ICD-10-CM

## 2021-10-28 MED ORDER — CITALOPRAM HYDROBROMIDE 20 MG PO TABS: ORAL_TABLET | ORAL | 3 refills | Status: DC

## 2021-10-28 NOTE — Patient Instructions (Signed)

## 2021-10-29 LAB — CBC
HCT: 39.4 % (ref 35.0–45.0)
Hemoglobin: 13.2 g/dL (ref 11.7–15.5)
MCH: 29.8 pg (ref 27.0–33.0)
MCHC: 33.5 g/dL (ref 32.0–36.0)
MCV: 88.9 fL (ref 80.0–100.0)
MPV: 10.9 fL (ref 7.5–12.5)
Platelets: 322 10*3/uL (ref 140–400)
RBC: 4.43 10*6/uL (ref 3.80–5.10)
RDW: 13.1 % (ref 11.0–15.0)
WBC: 8.4 10*3/uL (ref 3.8–10.8)

## 2021-10-29 LAB — LIPID PANEL
Cholesterol: 131 mg/dL (ref <200)
HDL: 61 mg/dL (ref >=50)
LDL Cholesterol (Calc): 56 mg/dL (calc)
Non-HDL Cholesterol (Calc): 70 mg/dL (calc) (ref <130)
Total CHOL/HDL Ratio: 2.1 (calc) (ref <5.0)
Triglycerides: 48 mg/dL (ref <150)

## 2021-10-29 LAB — TSH: TSH: 1.61 mIU/L

## 2021-11-04 LAB — CYTOLOGY - PAP
Comment: NEGATIVE
Diagnosis: UNDETERMINED — AB
High risk HPV: POSITIVE — AB

## 2021-11-10 ENCOUNTER — Other Ambulatory Visit: Payer: Self-pay

## 2021-11-10 ENCOUNTER — Other Ambulatory Visit: Payer: Self-pay | Admitting: Obstetrics and Gynecology

## 2021-11-10 DIAGNOSIS — F341 Dysthymic disorder: Secondary | ICD-10-CM

## 2021-11-10 DIAGNOSIS — R87610 Atypical squamous cells of undetermined significance on cytologic smear of cervix (ASC-US): Secondary | ICD-10-CM

## 2021-11-26 ENCOUNTER — Ambulatory Visit (INDEPENDENT_AMBULATORY_CARE_PROVIDER_SITE_OTHER): Payer: No Typology Code available for payment source | Admitting: Obstetrics and Gynecology

## 2021-11-26 NOTE — Patient Instructions (Signed)

## 2021-11-26 NOTE — Progress Notes (Signed)
Patient not seen.

## 2021-12-02 ENCOUNTER — Ambulatory Visit (INDEPENDENT_AMBULATORY_CARE_PROVIDER_SITE_OTHER): Payer: No Typology Code available for payment source | Admitting: Obstetrics and Gynecology

## 2021-12-02 ENCOUNTER — Other Ambulatory Visit (HOSPITAL_COMMUNITY)
Admission: RE | Admit: 2021-12-02 | Discharge: 2021-12-02 | Disposition: A | Discharge status: Home / Self Care | Payer: No Typology Code available for payment source | Source: Ambulatory Visit | Attending: Obstetrics and Gynecology | Admitting: Obstetrics and Gynecology

## 2021-12-02 ENCOUNTER — Other Ambulatory Visit: Payer: Self-pay

## 2021-12-02 ENCOUNTER — Encounter: Payer: Self-pay | Admitting: Obstetrics and Gynecology

## 2021-12-02 VITALS — BP 110/62 | HR 66 | Ht 67.0 in | Wt 210.0 lb

## 2021-12-02 DIAGNOSIS — R87810 Cervical high risk human papillomavirus (HPV) DNA test positive: Secondary | ICD-10-CM | POA: Diagnosis present

## 2021-12-02 DIAGNOSIS — R87610 Atypical squamous cells of undetermined significance on cytologic smear of cervix (ASC-US): Secondary | ICD-10-CM | POA: Diagnosis present

## 2021-12-02 NOTE — Progress Notes (Signed)
GYNECOLOGY  VISIT   HPI: 50 y.o.   Divorced White or Caucasian Not Hispanic or Latino  female   [phone] with Patient's last menstrual period was 02/11/2019.   here for  colposcopy. Pap from 10/28/21 returned as ASCUS, +HPV. No prior h/o abnormal paps.   GYNECOLOGIC HISTORY: Patient's last menstrual period was 02/11/2019. Contraception:pmp  Menopausal hormone therapy: none         OB History     Gravida  2   Para  2   Term  2   Preterm      AB      Living  2      SAB      IAB      Ectopic      Multiple      Live Births                 Patient Active Problem List   Diagnosis Date Noted   HNP (herniated nucleus pulposus), cervical 08/05/2021   Body mass index (BMI) 32.0-32.9, adult 07/17/2021   Cervical spondylosis 06/25/2021   Elevated blood-pressure reading, without diagnosis of hypertension 06/25/2021   Cervical radiculopathy 06/22/2021   Pain in lower limb 05/21/2021   Neck pain 10/06/2020   Vitamin D deficiency 01/09/2019   Dysphagia    Laryngopharyngeal reflux 09/23/2014   Incisional hernia - periumbilical 00/92/3300   PVC's (premature ventricular contractions) 09/14/2013    Past Medical History:  Diagnosis Date   Depression    Gastritis    GERD (gastroesophageal reflux disease)    History of palpitations    Kidney stones    h/o-no surgery    Past Surgical History:  Procedure Laterality Date   AUGMENTATION MAMMAPLASTY Bilateral    2008   BREAST ENHANCEMENT SURGERY  2009   BUNIONECTOMY  1989, 1998   left foot   CERVICAL [place] ARTHROPLASTY  09/18/2021   [place], 2001   x2   ESOPHAGEAL MANOMETRY N/A 09/19/2017   Procedure: ESOPHAGEAL MANOMETRY (EM);  Surgeon: Mauri Pole, MD;  [facility];  Service: Endoscopy;  Laterality: N/A;   LAPAROSCOPIC CHOLECYSTECTOMY  7622   UMBILICAL HERNIA REPAIR N/A 11/01/2013   Procedure: HERNIA REPAIR UMBILICAL ADULT;  Surgeon: Adin Hector, MD;  Location: [facility];   Service: General;  Laterality: N/A;    Current Outpatient Medications  Medication Sig Dispense Refill   acetaminophen (TYLENOL) 500 MG tablet      citalopram (CELEXA) 20 MG tablet Take one tablet (20 mg) by mouth daily. 90 tablet 3   ibuprofen (ADVIL,MOTRIN) 400 MG tablet Take 1-2 tablets (400-800 mg total) by mouth every 6 (six) hours as needed for fever, headache, mild pain, moderate pain or cramping. 40 tablet 1   Multiple Vitamins-Minerals (MULTIVITAMIN PO) Take by mouth daily.     Omega-3 Fatty Acids (FISH OIL) 1000 MG CAPS Take by mouth.     Probiotic Product (PROBIOTIC-10 PO) Take by mouth.     No current facility-administered medications for this visit.     ALLERGIES: Nickel  Family History  Problem Relation Age of Onset   Hepatitis Mother    Liver cancer Mother    Cirrhosis Mother        liver transplant   Kidney Stones Mother    Lung cancer Father 75       brain metastasis, being [facility] at [facility]   Esophageal cancer Paternal Uncle    Alcoholism Maternal Grandfather    Stomach cancer Paternal  Grandmother            Alcoholism Paternal Grandfather    Breast cancer Neg Hx    Colon cancer Neg Hx    Rectal cancer Neg Hx     Social History   Socioeconomic History   Marital status: Divorced    Spouse name: n/a   Number of children: 2   Years of education: 15   Highest education level: Not on file  Occupational History   Occupation: Pharmacist, community: [employer]  Tobacco Use   Smoking status: Former    Packs/day: 0.50    Years: 20.00    Pack years: 10.00    Types: Cigarettes    Quit date: 08/08/2017    Years since quitting: 4.3   Smokeless tobacco: Never   Tobacco comments:    quit 07/2017, but uses e cig  Vaping Use   Vaping Use: Every day   Devices: occasionally  Substance and Sexual Activity   Alcohol use: No    Alcohol/week: 0.0 standard drinks   Drug use: No   Sexual activity: Yes    Partners: Male    Birth  control/protection: None    Comment: Partner- vasectomy   Other Topics Concern   Not on file  Social History Narrative   Lives with her youngest daughter. Her older daughter lives in [place], [place].   Social Determinants of Health   Financial Resource Strain: Not on file  Food Insecurity: Not on file  Transportation Needs: Not on file  Physical Activity: Not on file  Stress: Not on file  Social Connections: Not on file  Intimate Partner Violence: Not on file    Review of Systems  All other systems reviewed and are negative.  PHYSICAL EXAMINATION:    BP 110/62    Pulse 66    Ht 5\' 7"  (1.702 m)    Wt 210 lb (95.3 kg)    LMP 02/11/2019    SpO2 100%    BMI 32.89 kg/m     General appearance: alert, cooperative and appears stated age   Pelvic: External genitalia:  no lesions              Urethra:  normal appearing urethra with no masses, tenderness or lesions              Bartholins and Skenes: normal                 Vagina: normal appearing vagina with normal color and discharge, no lesions              Cervix: no gross lesions  Colposcopy: unsatisfactory, no aceto-white changes. Lugols examination with decreased uptake of the left vaginal side wall (~1 cm area), biopsy taken. ECC done, needed to place a tenaculum in order to place the ECC curette. Biopsy site treated with silver nitrate.   Chaperone was present for exam.   1. ASCUS with positive high risk HPV cervical - Colposcopy - Surgical pathology( [place]/ [facility])

## 2021-12-02 NOTE — Patient Instructions (Signed)

## 2021-12-11 LAB — SURGICAL PATHOLOGY

## 2021-12-15 ENCOUNTER — Encounter: Payer: Self-pay | Admitting: Obstetrics and Gynecology

## 2022-04-16 ENCOUNTER — Emergency Department (HOSPITAL_BASED_OUTPATIENT_CLINIC_OR_DEPARTMENT_OTHER)
Admission: EM | Admit: 2022-04-16 | Discharge: 2022-04-17 | Disposition: A | Discharge status: Home / Self Care | Payer: Medicaid Other | Attending: Emergency Medicine | Admitting: Emergency Medicine

## 2022-04-16 ENCOUNTER — Encounter (HOSPITAL_BASED_OUTPATIENT_CLINIC_OR_DEPARTMENT_OTHER): Payer: Self-pay

## 2022-04-16 ENCOUNTER — Other Ambulatory Visit: Payer: Self-pay

## 2022-04-16 ENCOUNTER — Telehealth: Payer: Self-pay | Admitting: Gastroenterology

## 2022-04-16 ENCOUNTER — Emergency Department (HOSPITAL_BASED_OUTPATIENT_CLINIC_OR_DEPARTMENT_OTHER): Payer: Medicaid Other

## 2022-04-16 DIAGNOSIS — R109 Unspecified abdominal pain: Secondary | ICD-10-CM

## 2022-04-16 DIAGNOSIS — N200 Calculus of kidney: Secondary | ICD-10-CM | POA: Insufficient documentation

## 2022-04-16 DIAGNOSIS — R1013 Epigastric pain: Secondary | ICD-10-CM | POA: Diagnosis present

## 2022-04-16 LAB — COMPREHENSIVE METABOLIC PANEL
ALT: 29 U/L (ref 0–44)
AST: 23 U/L (ref 15–41)
Albumin: 4.6 g/dL (ref 3.5–5.0)
Alkaline Phosphatase: 91 U/L (ref 38–126)
Anion gap: 9 (ref 5–15)
BUN: 15 mg/dL (ref 6–20)
CO2: 26 mmol/L (ref 22–32)
Calcium: 9.8 mg/dL (ref 8.9–10.3)
Chloride: 103 mmol/L (ref 98–111)
Creatinine, Ser: 0.61 mg/dL (ref 0.44–1.00)
GFR, Estimated: >60 mL/min (ref >60)
Glucose, Bld: 103 mg/dL — ABNORMAL HIGH (ref 70–99)
Potassium: 4 mmol/L (ref 3.5–5.1)
Sodium: 138 mmol/L (ref 135–145)
Total Bilirubin: 0.4 mg/dL (ref 0.3–1.2)
Total Protein: 7.3 g/dL (ref 6.5–8.1)

## 2022-04-16 LAB — CBC
HCT: 39.5 % (ref 36.0–46.0)
Hemoglobin: 13.1 g/dL (ref 12.0–15.0)
MCH: 29.9 pg (ref 26.0–34.0)
MCHC: 33.2 g/dL (ref 30.0–36.0)
MCV: 90.2 fL (ref 80.0–100.0)
Platelets: 295 10*3/uL (ref 150–400)
RBC: 4.38 MIL/uL (ref 3.87–5.11)
RDW: 12.7 % (ref 11.5–15.5)
WBC: 8.7 10*3/uL (ref 4.0–10.5)
nRBC: 0 % (ref 0.0–0.2)

## 2022-04-16 LAB — URINALYSIS, ROUTINE W REFLEX MICROSCOPIC
Bilirubin Urine: NEGATIVE
Glucose, UA: NEGATIVE mg/dL
Hgb urine dipstick: NEGATIVE
Ketones, ur: NEGATIVE mg/dL
Leukocytes,Ua: NEGATIVE
Nitrite: NEGATIVE
Protein, ur: NEGATIVE mg/dL
Specific Gravity, Urine: 1.01 (ref 1.005–1.030)
pH: 6.5 (ref 5.0–8.0)

## 2022-04-16 LAB — TROPONIN I (HIGH SENSITIVITY): Troponin I (High Sensitivity): 2 ng/L (ref <18)

## 2022-04-16 LAB — LIPASE, BLOOD: Lipase: 11 U/L (ref 11–51)

## 2022-04-16 MED ORDER — SODIUM CHLORIDE 0.9 % IV BOLUS (SEPSIS)
500.0000 mL | Freq: Once | INTRAVENOUS | Status: AC
  Administered 2022-04-16: 500 mL via INTRAVENOUS

## 2022-04-16 MED ORDER — IOHEXOL 300 MG/ML  SOLN
100.0000 mL | Freq: Once | INTRAMUSCULAR | Status: AC | PRN
  Administered 2022-04-16: 100 mL via INTRAVENOUS

## 2022-04-16 MED ORDER — IOHEXOL 300 MG/ML  SOLN: 100.0000 mL | Freq: Once | INTRAMUSCULAR | Status: DC | PRN

## 2022-04-16 MED ORDER — PANTOPRAZOLE SODIUM 20 MG PO TBEC
20.0000 mg | DELAYED_RELEASE_TABLET | Freq: Two times a day (BID) | ORAL | 0 refills | Status: DC
Start: 2022-04-16 — End: 2022-12-28

## 2022-04-16 MED ORDER — SODIUM CHLORIDE 0.9 % IV SOLN: 1000.0000 mL | INTRAVENOUS | Status: DC

## 2022-04-16 NOTE — Telephone Encounter (Signed)
Line rings busy, will try again at a later time. 

## 2022-04-16 NOTE — Discharge Instructions (Signed)
Take the medications as prescribed.  Follow up with your primary care doctor or GI doctor to be rechecked next week.   ?

## 2022-04-16 NOTE — Telephone Encounter (Signed)
Based on her described symptomatology and lack of improvement with appropriate medication trial, I recommend going to the ER for expedited evaluation, to include examination, labs, and potentially imaging as appropriate. ?

## 2022-04-16 NOTE — Telephone Encounter (Signed)
Spoke with patient regarding MD recommendations. She said she would plan to go after work today.  ?

## 2022-04-16 NOTE — ED Notes (Signed)
Patient transported to CT 

## 2022-04-16 NOTE — Telephone Encounter (Signed)
Patient called in with complaints of dull, mid upper abdominal pain. She says the pain becomes sharp with walking, and radiates up to left collar bone. Abdomen is tender to the touch. She is not experiencing any other symptoms, and is having normal bowel movements. She thought it may be heartburn, so she tried pepto and had no relief. Patient seeking recommendations, will route to MD. Patient's last OV was in 03/2021 & endo colon in 05/2021. ?

## 2022-04-16 NOTE — ED Triage Notes (Signed)
Patient here POV from Home with ABD Pain. ? ?Endorses ABD Is Epigastric and radiates to left Collar Bone. Intermittent in nature and worsens with Walking and is associated with SOB. ? ?No N/VD. No Constipation.  ? ?NAD Noted during Triage. A&Ox4. Gcs 15. Ambulatory. ?

## 2022-04-16 NOTE — Telephone Encounter (Signed)
Unable to reach patient x 3 at the contact number she provided. Line rings busy each time.  ?

## 2022-04-16 NOTE — Telephone Encounter (Signed)
Inbound call from patient reports she is experiencing severe abd pain. Best contact number [phone] ?

## 2022-04-16 NOTE — ED Provider Notes (Signed)
?[facility] ?Provider Note ? ? ?CSN: [id number] ?Arrival date & time: 04/16/22  1900 ? ?  ? ?History ? ?Chief Complaint  ?Patient presents with  ? Abdominal Pain  ? ? ?Shelly Blackwell is a 51 y.o. female. ? ? ?Abdominal Pain ? ? Pt has history of GERD and prior cholecystectomy.  She has been having pain in her abdomen for several days.  The pain comes and goes.  At times it is very intense and severe.  It radiates to her collar bone.  The pain increases with movement and eating.  She called her doctor and was told to come to the ED.  No fever.  No vomiting or diarrhea. ? ?Home Medications ?Prior to Admission medications   ?Medication Sig Start Date End Date Taking? Authorizing Provider  ?pantoprazole (PROTONIX) 20 MG tablet Take 1 tablet (20 mg total) by mouth 2 (two) times daily for 14 days. 04/16/22 04/30/22 Yes Dorie Rank, MD  ?acetaminophen (TYLENOL) 500 MG tablet     [provider]  ?citalopram (CELEXA) 20 MG tablet Take one tablet (20 mg) by mouth daily. 10/28/21   Salvadore Dom, MD  ?ibuprofen (ADVIL,MOTRIN) 400 MG tablet Take 1-2 tablets (400-800 mg total) by mouth every 6 (six) hours as needed for fever, headache, mild pain, moderate pain or cramping. 11/01/13   Michael Boston, MD  ?Multiple Vitamins-Minerals (MULTIVITAMIN PO) Take by mouth daily.    [provider]  ?Omega-3 Fatty Acids (FISH OIL) 1000 MG CAPS Take by mouth.    [provider]  ?Probiotic Product (PROBIOTIC-10 PO) Take by mouth.    [provider]  ?   ? ?Allergies    ?Nickel   ? ?Review of Systems   ?Review of Systems  ?Gastrointestinal:  Positive for abdominal pain.  ?All other systems reviewed and are negative. ? ?Physical Exam ?Updated Vital Signs ?BP (!) 136/94 (BP Location: Right Arm)   Pulse 62   Temp 98.4 ?F (36.9 ?C)   Resp 16   Ht 1.702 m ('5\' 7"'$ )   Wt 95.3 kg   LMP 02/11/2019   SpO2 99%   BMI 32.91 kg/m?  ?Physical Exam ?Vitals and nursing note reviewed.   ?Constitutional:   ?   General: She is not in acute distress. ?   Appearance: She is well-developed.  ?HENT:  ?   Head: Normocephalic and atraumatic.  ?   Right Ear: External ear normal.  ?   Left Ear: External ear normal.  ?Eyes:  ?   General: No scleral icterus.    ?   Right eye: No discharge.     ?   Left eye: No discharge.  ?   Conjunctiva/sclera: Conjunctivae normal.  ?Neck:  ?   Trachea: No tracheal deviation.  ?Cardiovascular:  ?   Rate and Rhythm: Normal rate and regular rhythm.  ?Pulmonary:  ?   Effort: Pulmonary effort is normal. No respiratory distress.  ?   Breath sounds: Normal breath sounds. No stridor. No wheezing or rales.  ?Abdominal:  ?   General: Bowel sounds are normal. There is no distension.  ?   Palpations: Abdomen is soft.  ?   Tenderness: There is abdominal tenderness in the epigastric area. There is no guarding or rebound.  ?Musculoskeletal:     ?   General: No tenderness or deformity.  ?   Cervical back: Neck supple.  ?Skin: ?   General: Skin is warm and dry.  ?   Findings: No  rash.  ?Neurological:  ?   General: No focal deficit present.  ?   Mental Status: She is alert.  ?   Cranial Nerves: No cranial nerve deficit (no facial droop, extraocular movements intact, no slurred speech).  ?   Sensory: No sensory deficit.  ?   Motor: No abnormal muscle tone or seizure activity.  ?   Coordination: Coordination normal.  ?Psychiatric:     ?   Mood and Affect: Mood normal.  ? ? ?ED Results / Procedures / Treatments   ?Labs ?(all labs ordered are listed, but only abnormal results are displayed) ?Labs Reviewed  ?COMPREHENSIVE METABOLIC PANEL - Abnormal; Notable for the following components:  ?    Result Value  ? Glucose, Bld 103 (*)   ? All other components within normal limits  ?URINALYSIS, ROUTINE W REFLEX MICROSCOPIC - Abnormal; Notable for the following components:  ? Color, Urine COLORLESS (*)   ? All other components within normal limits  ?LIPASE, BLOOD  ?CBC  ?TROPONIN I (HIGH SENSITIVITY)   ? ? ?EKG ?EKG Interpretation ? ?Date/Time:  Friday Apr 16 2022 19:37:06 EDT ?Ventricular Rate:  63 ?PR Interval:  146 ?QRS Duration: 76 ?QT Interval:  390 ?QTC Calculation: 399 ?R Axis:   76 ?Text Interpretation: Normal sinus rhythm Normal ECG When compared with ECG of 02-Aug-2017 17:13, No significant change was found Confirmed by Dorie Rank [id number]) on 04/16/2022 9:24:49 PM ? ?Radiology ?CT ABDOMEN PELVIS W CONTRAST ? ?Result Date: 04/16/2022 ?CLINICAL DATA:  Acute abdominal pain. Patient reports epigastric pain radiating to the left. EXAM: CT ABDOMEN AND PELVIS WITH CONTRAST TECHNIQUE: Multidetector CT imaging of the abdomen and pelvis was performed using the standard protocol following bolus administration of intravenous contrast. RADIATION DOSE REDUCTION: This exam was performed according to the departmental dose-optimization program which includes automated exposure control, adjustment of the mA and/or kV according to patient size and/or use of iterative reconstruction technique. CONTRAST:  142m OMNIPAQUE IOHEXOL 300 MG/ML  SOLN COMPARISON:  No prior CT.  Ultrasound 02/05/2021 reviewed FINDINGS: Lower chest: No acute airspace disease or pleural effusion. The heart is normal in size. Hepatobiliary: No focal liver abnormality is seen. Status post cholecystectomy. No biliary dilatation. Pancreas: No ductal dilatation or inflammation. Spleen: Normal in size without focal abnormality. Adrenals/Urinary Tract: Normal adrenal glands. No hydronephrosis or perinephric edema. Homogeneous renal enhancement with symmetric excretion on delayed phase imaging. There are small nonobstructing calculi in the upper pole of the right kidney. Tiny subcentimeter hypodensity in the posterior mid left kidney is too small to characterize. There is also an 11 mm water density lesion in the posterior left kidney consistent with cyst. This needs no dedicated further follow-up. No solid renal lesion. Urinary bladder is nondistended and not  well assessed. Stomach/Bowel: Stomach is only minimally distended, unremarkable for degree of distension. Tiny duodenal diverticulum. No small bowel obstruction or inflammation. Normal appendix. Moderate volume of colonic stool mild colonic tortuosity. No colonic wall thickening. No significant diverticular disease. Vascular/Lymphatic: Normal caliber abdominal aorta. Patent portal, splenic, and mesenteric veins. Prominent left adnexal vascularity and dilatation of the left gonadal vein, 7-no abdominopelvic adenopathy. 8 mm. Reproductive: Prominent left adnexal and periuterine vascularity and dilatation of the ovarian vein. Quiescent ovaries. Uterus is retroverted Other: No free air, free fluid, or intra-abdominal fluid collection. No abdominal wall hernia. Musculoskeletal: There are no acute or suspicious osseous abnormalities. IMPRESSION: 1. Prominent left adnexal and periuterine vascularity and dilatation of the left gonadal vein, can be seen with pelvic  congestion syndrome in the appropriate clinical setting. 2. Nonobstructing right nephrolithiasis. Electronically Signed   By: Keith Rake M.D.   On: 04/16/2022 23:07  ? ?DG Chest Port 1 View ? ?Result Date: 04/16/2022 ?CLINICAL DATA:  Abdominal pain and epigastric pain. EXAM: PORTABLE CHEST 1 VIEW COMPARISON:  Chest x-ray 10/29/2013. FINDINGS: The heart size and mediastinal contours are within normal limits. Both lungs are clear. The visualized skeletal structures are unremarkable. IMPRESSION: No active disease. Electronically Signed   By: Ronney Asters M.D.   On: 04/16/2022 20:47   ? ?Procedures ?Procedures  ? ? ?Medications Ordered in ED ?Medications  ?sodium chloride 0.9 % bolus 500 mL (500 mLs Intravenous New Bag/Given 04/16/22 2310)  ?  Followed by  ?0.9 %  sodium chloride infusion (has no administration in time range)  ?iohexol (OMNIPAQUE) 300 MG/ML solution 100 mL (has no administration in time range)  ?iohexol (OMNIPAQUE) 300 MG/ML solution 100 mL (100  mLs Intravenous Contrast Given 04/16/22 2246)  ? ? ?ED Course/ Medical Decision Making/ A&P ?Clinical Course as of 04/16/22 2338  ?Fri Apr 16, 2022  ?2124 DG Chest Port 1 View ?nl [JK]  ?2148 Urinalysis, Routine w refle

## 2022-06-02 ENCOUNTER — Ambulatory Visit (INDEPENDENT_AMBULATORY_CARE_PROVIDER_SITE_OTHER): Payer: Medicaid Other | Admitting: Obstetrics and Gynecology

## 2022-06-02 ENCOUNTER — Encounter: Payer: Self-pay | Admitting: Obstetrics and Gynecology

## 2022-06-02 VITALS — BP 110/70 | HR 72 | Ht 67.0 in | Wt 204.0 lb

## 2022-06-02 DIAGNOSIS — Z6831 Body mass index (BMI) 31.0-31.9, adult: Secondary | ICD-10-CM

## 2022-06-02 DIAGNOSIS — R5383 Other fatigue: Secondary | ICD-10-CM

## 2022-06-02 DIAGNOSIS — R3915 Urgency of urination: Secondary | ICD-10-CM

## 2022-06-02 DIAGNOSIS — R102 Pelvic and perineal pain: Secondary | ICD-10-CM | POA: Diagnosis not present

## 2022-06-02 DIAGNOSIS — N3941 Urge incontinence: Secondary | ICD-10-CM

## 2022-06-02 NOTE — Progress Notes (Signed)
GYNECOLOGY  VISIT   HPI: 51 y.o.   Divorced White or Caucasian Not Hispanic or Latino  female   [phone] with Patient's last menstrual period was 02/11/2019.   here for follow up pap. Pap from 10/28/21 returned as ASCUS, +HPV. No prior h/o abnormal paps. Colposcopy on 12/02/21 was unsatisfactory, vaginal biopsy returned with VAIN I, ECC with atypical squamous epithelium, negative testing for hpv 16.   She is struggling to loose weight. Exercising 5 days a week. She walks quickly for at least 30 minutes. Feels bloated and tired. She watches her portion control. Eats tons of vegetables, eats lean proteins, nuts. Limits her carbs. She doesn't drink ETOH, soda, sweet tea. Only drinks water and coffee (no cream).  She was seen in the ED and was told she had congestive pelvic syndrome. She had a CT with an incidental finding of dilated pelvic vasculature.  She is PMP. She c/o a 6 month h/o of intermittent pelvic pain that feels like faint menstrual cramps. Worse when she is sitting or standing for a long time. 1-2/10 in severity. Doesn't worsen during the day, worse when she is sitting still.  Not sexually active since March. She was having some deep dyspareunia, not positional.   She has some urgency to void for almost a year, occasional small amounts of urge incontinence. No dysuria. Drinks 2-3, 8 oz cups of coffee a day.  Normal BM qd.   Normal CBC in 5/23.   Normal TSH in 11/22.   GYNECOLOGIC HISTORY: Patient's last menstrual period was 02/11/2019. Contraception:not sexually active  Menopausal hormone therapy: none         OB History     Gravida  2   Para  2   Term  2   Preterm      AB      Living  2      SAB      IAB      Ectopic      Multiple      Live Births                 Patient Active Problem List   Diagnosis Date Noted   HNP (herniated nucleus pulposus), cervical 08/05/2021   Body mass index (BMI) 32.0-32.9, adult 07/17/2021   Cervical spondylosis  06/25/2021   Elevated blood-pressure reading, without diagnosis of hypertension 06/25/2021   Cervical radiculopathy 06/22/2021   Pain in lower limb 05/21/2021   Neck pain 10/06/2020   Vitamin D deficiency 01/09/2019   Dysphagia    Laryngopharyngeal reflux 09/23/2014   Incisional hernia - periumbilical 32/35/5732   PVC's (premature ventricular contractions) 09/14/2013    Past Medical History:  Diagnosis Date   Depression    Gastritis    GERD (gastroesophageal reflux disease)    History of palpitations    Kidney stones    h/o-no surgery    Past Surgical History:  Procedure Laterality Date   AUGMENTATION MAMMAPLASTY Bilateral    2008   BREAST ENHANCEMENT SURGERY  2009   BUNIONECTOMY  1989, 1998   left foot   CERVICAL [place] ARTHROPLASTY  09/18/2021   [place], 2001   x2   ESOPHAGEAL MANOMETRY N/A 09/19/2017   Procedure: ESOPHAGEAL MANOMETRY (EM);  Surgeon: Mauri Pole, MD;  Location: [facility];  Service: Endoscopy;  Laterality: N/A;   LAPAROSCOPIC CHOLECYSTECTOMY  2025   UMBILICAL HERNIA REPAIR N/A 11/01/2013   Procedure: HERNIA REPAIR UMBILICAL ADULT;  Surgeon: Adin Hector, MD;  Location: [facility] ORS;  Service: General;  Laterality: N/A;    Current Outpatient Medications  Medication Sig Dispense Refill   acetaminophen (TYLENOL) 500 MG tablet      citalopram (CELEXA) 20 MG tablet Take one tablet (20 mg) by mouth daily. 90 tablet 3   ibuprofen (ADVIL,MOTRIN) 400 MG tablet Take 1-2 tablets (400-800 mg total) by mouth every 6 (six) hours as needed for fever, headache, mild pain, moderate pain or cramping. 40 tablet 1   Multiple Vitamins-Minerals (MULTIVITAMIN PO) Take by mouth daily.     Omega-3 Fatty Acids (FISH OIL) 1000 MG CAPS Take by mouth.     pantoprazole (PROTONIX) 20 MG tablet Take 1 tablet (20 mg total) by mouth 2 (two) times daily for 14 days. 28 tablet 0   Probiotic Product (PROBIOTIC-10 PO) Take by mouth. (Patient not taking: Reported on  06/02/2022)     No current facility-administered medications for this visit.     ALLERGIES: Nickel  Family History  Problem Relation Age of Onset   Hepatitis Mother    Liver cancer Mother    Cirrhosis Mother        liver transplant   Kidney Stones Mother    Lung cancer Father 15       brain metastasis, being treated at [facility]   Esophageal cancer Paternal Uncle    Alcoholism Maternal Grandfather    Stomach cancer Paternal Grandmother            Alcoholism Paternal Grandfather    Breast cancer Neg Hx    Colon cancer Neg Hx    Rectal cancer Neg Hx     Social History   Socioeconomic History   Marital status: Divorced    Spouse name: n/a   Number of children: 2   Years of education: 15   Highest education level: Not on file  Occupational History   Occupation: Pharmacist, community: [employer]  Tobacco Use   Smoking status: Former    Packs/day: 0.50    Years: 20.00    Total pack years: 10.00    Types: Cigarettes    Quit date: 08/08/2017    Years since quitting: 4.8   Smokeless tobacco: Never   Tobacco comments:    quit 07/2017, but uses e cig  Vaping Use   Vaping Use: Every day   Devices: occasionally  Substance and Sexual Activity   Alcohol use: No    Alcohol/week: 0.0 standard drinks of alcohol   Drug use: No   Sexual activity: Yes    Partners: Male    Birth control/protection: None    Comment: Partner- vasectomy   Other Topics Concern   Not on file  Social History Narrative   Lives with her youngest daughter. Her older daughter lives in [place], [place].   Social Determinants of Health   Financial Resource Strain: Not on file  Food Insecurity: Not on file  Transportation Needs: Not on file  Physical Activity: Not on file  Stress: Not on file  Social Connections: Not on file  Intimate Partner Violence: Not on file    Review of Systems  All other systems reviewed and are negative.   PHYSICAL EXAMINATION:    BP 110/70    Pulse 72   Ht '5\' 7"'$  (1.702 m)   Wt 204 lb (92.5 kg)   LMP 02/11/2019   SpO2 100%   BMI 31.95 kg/m     General appearance: alert, cooperative and appears stated age Neck: no  adenopathy, supple, symmetrical, trachea midline and thyroid normal to inspection and palpation Abdomen: soft, non-tender; non distended, no masses,  no organomegaly  Pelvic: External genitalia:  no lesions              Urethra:  normal appearing urethra with no masses, tenderness or lesions              Bartholins and Skenes: normal                 Vagina: normal appearing vagina with normal color and discharge, no lesions              Cervix: no cervical motion tenderness and no lesions              Bimanual Exam:  Uterus:  normal size, contour, position, consistency, mobility, non-tender              Adnexa:  no masses, equivocally tender on the left  Pelvic floor: not tender               Chaperone was present for exam.  1. Pelvic pain 6 month h/o of mild, intermittent pelvic pain. Incidental finding on recent CT of prominent left adnexal and periuterine vascularity. I don't think she has pelvic congestion syndrome, her PMP status, history and exam do not go along with this diagnosis. No bowel symptoms, no pelvic floor tenderness. -will check for GC/CT/Trich -check urine  2. BMI 31.0-31.9,adult Discussed healthy diet and exercise -Given the # for the weight loss clinic - TSH  3. Other fatigue Recent normal CBC - TSH  4. Urinary urgency - Urinalysis,Complete w/RFL Culture - Urine Culture  5. Urge incontinence -Given information on incontinence and kegel exercises - Urinalysis,Complete w/RFL Culture - Urine Culture -Cut back on caffeine -Discussed option of PT and medication  Addendum: her pap isn't due until 11/23.   Over 35 minutes spent in total patient care

## 2022-06-02 NOTE — Addendum Note (Signed)
Addended by: Joaquin Music on: 06/02/2022 09:38 AM   Modules accepted: Orders

## 2022-06-02 NOTE — Patient Instructions (Signed)
Kegel Exercises  Kegel exercises can help strengthen your pelvic floor muscles. The pelvic floor is a group of muscles that support your rectum, small intestine, and bladder. In females, pelvic floor muscles also help support the uterus. These muscles help you control the flow of urine and stool (feces). Kegel exercises are painless and simple. They do not require any equipment. Your provider may suggest Kegel exercises to: Improve bladder and bowel control. Improve sexual response. Improve weak pelvic floor muscles after surgery to remove the uterus (hysterectomy) or after pregnancy, in females. Improve weak pelvic floor muscles after prostate gland removal or surgery, in males. Kegel exercises involve squeezing your pelvic floor muscles. These are the same muscles you squeeze when you try to stop the flow of urine or keep from passing gas. The exercises can be done while sitting, standing, or lying down, but it is best to vary your position. Ask your health care provider which exercises are safe for you. Do exercises exactly as told by your health care provider and adjust them as directed. Do not begin these exercises until told by your health care provider. Exercises How to do Kegel exercises: Squeeze your pelvic floor muscles tight. You should feel a tight lift in your rectal area. If you are a female, you should also feel a tightness in your vaginal area. Keep your stomach, buttocks, and legs relaxed. Hold the muscles tight for up to 10 seconds. Breathe normally. Relax your muscles for up to 10 seconds. Repeat as told by your health care provider. Repeat this exercise daily as told by your health care provider. Continue to do this exercise for at least 4-6 weeks, or for as long as told by your health care provider. You may be referred to a physical therapist who can help you learn more about how to do Kegel exercises. Depending on your condition, your health care provider may  recommend: Varying how long you squeeze your muscles. Doing several sets of exercises every day. Doing exercises for several weeks. Making Kegel exercises a part of your regular exercise routine. This information is not intended to replace advice given to you by your health care provider. Make sure you discuss any questions you have with your health care provider. Document Revised: 04/09/2021 Document Reviewed: 04/09/2021 Elsevier Patient Education  [place]. Urinary Incontinence Urinary incontinence refers to a condition in which a person is unable to control where and when to pass urine. A person with this condition will urinate involuntarily. This means that the person urinates when he or she does not mean to. What are the causes? This condition may be caused by: Medicines. Infections. Constipation. Overactive bladder muscles. Weak bladder muscles. Weak pelvic floor muscles. These muscles provide support for the bladder, intestine, and, in women, the uterus. Enlarged prostate in men. The prostate is a gland near the bladder. When it gets too big, it can pinch the urethra. With the urethra blocked, the bladder can weaken and lose the ability to empty properly. Surgery. Emotional factors, such as anxiety, stress, or post-traumatic stress disorder (PTSD). Spinal cord injury, nerve injury, or other neurological conditions. Pelvic organ prolapse. This happens in women when organs move out of place and into the vagina. This movement can prevent the bladder and urethra from working properly. What increases the risk? The following factors may make you more likely to develop this condition: Age. The older you are, the higher the risk. Obesity. Being physically inactive. Pregnancy and childbirth. Menopause. Diseases that affect  the nerves or spinal cord. Long-term, or chronic, coughing. This can increase pressure on the bladder and pelvic floor muscles. What are the signs or  symptoms? Symptoms may vary depending on the type of urinary incontinence you have. They include: A sudden urge to urinate, and passing urine involuntarily before you can get to a bathroom (urge incontinence). Suddenly passing urine when doing activities that force urine to pass, such as coughing, laughing, exercising, or sneezing (stress incontinence). Needing to urinate often but urinating only a small amount, or constantly dribbling urine (overflow incontinence). Urinating because you cannot get to the bathroom in time due to a physical disability, such as arthritis or injury, or due to a communication or thinking problem, such as Alzheimer's disease (functional incontinence). How is this diagnosed? This condition may be diagnosed based on: Your medical history. A physical exam. Tests, such as: Urine tests. X-rays of your kidney and bladder. Ultrasound. CT scan. Cystoscopy. In this procedure, a health care provider inserts a tube with a light and camera (cystoscope) through the urethra and into the bladder to check for problems. Urodynamic testing. These tests assess how well the bladder, urethra, and sphincter can store and release urine. There are different types of urodynamic tests, and they vary depending on what the test is measuring. To help diagnose your condition, your health care provider may recommend that you keep a log of when you urinate and how much you urinate. How is this treated? Treatment for this condition depends on the type of incontinence that you have and its cause. Treatment may include: Lifestyle changes, such as: Quitting smoking. Maintaining a healthy weight. Staying active. Try to get 150 minutes of moderate-intensity exercise every week. Ask your health care provider which activities are safe for you. Eating a healthy diet. Avoid high-fat foods, like fried foods. Avoid refined carbohydrates like white bread and white rice. Limit how much alcohol and caffeine  you drink. Increase your fiber intake. Healthy sources of fiber include beans, whole grains, and fresh fruits and vegetables. Behavioral changes, such as: Pelvic floor muscle exercises. Bladder training, such as lengthening the amount of time between bathroom breaks, or using the bathroom at regular intervals. Using techniques to suppress bladder urges. This can include distraction techniques or controlled breathing exercises. Medicines, such as: Medicines to relax the bladder muscles and prevent bladder spasms. Medicines to help slow or prevent the growth of a man's prostate. Botox injections. These can help relax the bladder muscles. Treatments, such as: Using pulses of electricity to help change bladder reflexes (electrical nerve stimulation). For women, using a medical device to prevent urine leaks. This is a small, tampon-like, disposable device that is inserted into the urethra. Injecting collagen or carbon beads (bulking agents) into the urinary sphincter. These can help thicken tissue and close the bladder opening. Surgery. Follow these instructions at home: Lifestyle Limit alcohol and caffeine. These can fill your bladder quickly and irritate it. Keep yourself clean to help prevent odors and skin damage. Ask your health care provider about special skin creams and cleansers that can protect the skin from urine. Consider wearing pads or adult diapers. Make sure to change them regularly, and always change them right after experiencing incontinence. General instructions Take over-the-counter and prescription medicines only as told by your health care provider. Use the bathroom about every 3-4 hours, even if you do not feel the need to urinate. Try to empty your bladder completely every time. After urinating, wait a minute. Then try to urinate  again. Make sure you are in a relaxed position while urinating. If your incontinence is caused by nerve problems, keep a log of the medicines you  take and the times you go to the bathroom. Keep all follow-up visits. This is important. Where to find more information [employer] Diabetes and Digestive and Kidney Diseases: [employer] American Urology Association: www.urologyhealth.org Contact a health care provider if: You have pain that gets worse. Your incontinence gets worse. Get help right away if: You have a fever or chills. You are unable to urinate. You have redness in your groin area or down your legs. Summary Urinary incontinence refers to a condition in which a person is unable to control where and when to pass urine. This condition may be caused by medicines, infection, weak bladder muscles, weak pelvic floor muscles, enlargement of the prostate (in men), or surgery. Factors such as older age, obesity, pregnancy and childbirth, menopause, neurological diseases, and chronic coughing may increase your risk for developing this condition. Types of urinary incontinence include urge incontinence, stress incontinence, overflow incontinence, and functional incontinence. This condition is usually treated first with lifestyle and behavioral changes, such as quitting smoking, eating a healthier diet, and doing regular pelvic floor exercises. Other treatment options include medicines, bulking agents, medical devices, electrical nerve stimulation, or surgery. This information is not intended to replace advice given to you by your health care provider. Make sure you discuss any questions you have with your health care provider. Document Revised: 07/04/2020 Document Reviewed: 07/04/2020 Elsevier Patient Education  [place].

## 2022-06-03 ENCOUNTER — Encounter: Payer: Self-pay | Admitting: Obstetrics and Gynecology

## 2022-06-03 LAB — SURESWAB CT/NG/T. VAGINALIS
C. trachomatis RNA, TMA: NOT DETECTED
N. gonorrhoeae RNA, TMA: NOT DETECTED
Trichomonas vaginalis RNA: NOT DETECTED

## 2022-06-03 LAB — TSH: TSH: 2.69 mIU/L

## 2022-06-04 LAB — URINALYSIS, COMPLETE W/RFL CULTURE
Glucose, UA: NEGATIVE
Hgb urine dipstick: NEGATIVE
Hyaline Cast: NONE SEEN /LPF
Ketones, ur: NEGATIVE
Nitrites, Initial: NEGATIVE
Specific Gravity, Urine: 1.02 (ref 1.001–1.035)
pH: 5.5 (ref 5.0–8.0)

## 2022-06-04 LAB — URINE CULTURE
MICRO NUMBER:: 13553415
Result:: NO GROWTH
SPECIMEN QUALITY:: ADEQUATE

## 2022-06-04 LAB — CULTURE INDICATED

## 2022-06-23 ENCOUNTER — Other Ambulatory Visit: Payer: Self-pay | Admitting: Obstetrics and Gynecology

## 2022-06-23 DIAGNOSIS — Z1231 Encounter for screening mammogram for malignant neoplasm of breast: Secondary | ICD-10-CM

## 2022-07-01 ENCOUNTER — Ambulatory Visit
Admission: RE | Admit: 2022-07-01 | Discharge: 2022-07-01 | Disposition: A | Discharge status: Home / Self Care | Payer: BC Managed Care – PPO | Source: Ambulatory Visit | Attending: Obstetrics and Gynecology | Admitting: Obstetrics and Gynecology

## 2022-07-01 DIAGNOSIS — Z1231 Encounter for screening mammogram for malignant neoplasm of breast: Secondary | ICD-10-CM

## 2022-07-11 ENCOUNTER — Other Ambulatory Visit: Payer: Self-pay

## 2022-07-11 ENCOUNTER — Emergency Department (HOSPITAL_BASED_OUTPATIENT_CLINIC_OR_DEPARTMENT_OTHER): Payer: BC Managed Care – PPO | Admitting: Radiology

## 2022-07-11 ENCOUNTER — Emergency Department (HOSPITAL_BASED_OUTPATIENT_CLINIC_OR_DEPARTMENT_OTHER)
Admission: EM | Admit: 2022-07-11 | Discharge: 2022-07-11 | Disposition: A | Discharge status: Home / Self Care | Payer: BC Managed Care – PPO | Attending: Emergency Medicine | Admitting: Emergency Medicine

## 2022-07-11 DIAGNOSIS — W228XXA Striking against or struck by other objects, initial encounter: Secondary | ICD-10-CM | POA: Insufficient documentation

## 2022-07-11 DIAGNOSIS — S92511A Displaced fracture of proximal phalanx of right lesser toe(s), initial encounter for closed fracture: Secondary | ICD-10-CM | POA: Diagnosis not present

## 2022-07-11 DIAGNOSIS — S99922A Unspecified injury of left foot, initial encounter: Secondary | ICD-10-CM | POA: Diagnosis present

## 2022-07-11 NOTE — Discharge Instructions (Signed)
Your history, exam, work-up today are consistent with a small toe fracture as we discussed.  Your previous toe injuries appear to have healed well.  Please use the fracture shoe and follow-up with your podiatrist/foot orthopedist you have seen in the past.  Please use over-the-counter medications and keep it elevated to help with the swelling.  If any symptoms change or worsen acutely, please return to the nearest emergency department.

## 2022-07-11 NOTE — ED Provider Notes (Signed)
[facility] Provider Note   CSN: [id number] Arrival date & time: 07/11/22  1007     History  Chief Complaint  Patient presents with   Toe Injury    Right Small toe    Shelly Blackwell is a 51 y.o. female.  The history is provided by the patient and medical records. No language interpreter was used.  Toe Pain This is a new problem. The current episode started 6 to 12 hours ago. The problem occurs constantly. The problem has not changed since onset.Pertinent negatives include no chest pain, no abdominal pain and no headaches. The symptoms are aggravated by walking. Nothing relieves the symptoms. She has tried nothing for the symptoms. The treatment provided no relief.       Home Medications Prior to Admission medications   Medication Sig Start Date End Date Taking? Authorizing Provider  acetaminophen (TYLENOL) 500 MG tablet     [provider]  citalopram (CELEXA) 20 MG tablet Take one tablet (20 mg) by mouth daily. 10/28/21   Salvadore Dom, MD  ibuprofen (ADVIL,MOTRIN) 400 MG tablet Take 1-2 tablets (400-800 mg total) by mouth every 6 (six) hours as needed for fever, headache, mild pain, moderate pain or cramping. 11/01/13   Michael Boston, MD  Multiple Vitamins-Minerals (MULTIVITAMIN PO) Take by mouth daily.    [provider]  Omega-3 Fatty Acids (FISH OIL) 1000 MG CAPS Take by mouth.    [provider]  pantoprazole (PROTONIX) 20 MG tablet Take 1 tablet (20 mg total) by mouth 2 (two) times daily for 14 days. 04/16/22 04/30/22  Dorie Rank, MD  Probiotic Product (PROBIOTIC-10 PO) Take by mouth. Patient not taking: Reported on 06/02/2022    [provider]      Allergies    Nickel    Review of Systems   Review of Systems  Constitutional:  Negative for chills, fatigue and fever.  HENT:  Negative for congestion.   Respiratory:  Negative for cough.   Cardiovascular:  Negative for chest pain.   Gastrointestinal:  Negative for abdominal pain.  Musculoskeletal:  Negative for back pain.  Skin:  Positive for color change. Negative for rash and wound.  Neurological:  Negative for dizziness, light-headedness and headaches.  Psychiatric/Behavioral:  Negative for agitation.   All other systems reviewed and are negative.   Physical Exam Updated Vital Signs BP 120/84   Pulse 63   Temp 98.7 F (37.1 C)   Resp 16   LMP 02/11/2019   SpO2 98%  Physical Exam Vitals and nursing note reviewed.  Constitutional:      General: She is not in acute distress.    Appearance: She is well-developed.  HENT:     Head: Normocephalic and atraumatic.  Eyes:     Conjunctiva/sclera: Conjunctivae normal.  Cardiovascular:     Rate and Rhythm: Normal rate and regular rhythm.     Heart sounds: No murmur heard. Pulmonary:     Effort: Pulmonary effort is normal. No respiratory distress.     Breath sounds: Normal breath sounds.  Abdominal:     Tenderness: There is no abdominal tenderness.  Musculoskeletal:        General: Swelling and tenderness present.     Cervical back: Neck supple.     Right lower leg: No edema.     Left lower leg: No edema.     Right foot: Normal range of motion and normal capillary refill. Swelling, tenderness and bony tenderness present. No foot  drop or laceration. Normal pulse.       Legs:  Skin:    General: Skin is warm and dry.     Capillary Refill: Capillary refill takes less than 2 seconds.     Findings: Bruising present. No erythema or rash.  Neurological:     Mental Status: She is alert.     Sensory: No sensory deficit.     Motor: No weakness.  Psychiatric:        Mood and Affect: Mood normal.     ED Results / Procedures / Treatments   Labs (all labs ordered are listed, but only abnormal results are displayed) Labs Reviewed - No data to display  EKG None  Radiology DG Foot Complete Right  Result Date: 07/11/2022 CLINICAL DATA:  toe injury history  of injury and pain at the fourth toe EXAM: RIGHT FOOT COMPLETE - 3+ VIEW COMPARISON:  None Available. FINDINGS: There is minimally displaced oblique fracture seen at the distal aspect of the proximal phalanx of the fourth toe. Remainder of the osseous structures have a normal appearance. IMPRESSION: Minimally displaced oblique fracture at the distal aspect of the proximal phalanx of the fourth toe. Electronically Signed   By: Frazier Richards M.D.   On: 07/11/2022 10:47    Procedures Procedures    Medications Ordered in ED Medications - No data to display  ED Course/ Medical Decision Making/ A&P                           Medical Decision Making Amount and/or Complexity of Data Reviewed Radiology: ordered.    Shelly Blackwell is a 51 y.o. female with past medical history significant for kidney stones, GERD, and previous toe injuries who presents with foot injury.  Patient reports that last night while following her son walking they were both barefoot and the son stopped in front of her and then she jammed his heel with her right foot causing immediate onset of pain.  She reports that it was deformed initially and then she pulled on it to straighten it, heard a crunch, and then it was straight.  She reports has been bruising and swelling and wants to make sure she did not break her toe.  She reportedly has broken 2 toes in the past and has otherwise not had further symptoms with them.  She denies any other injuries or traumas.  Denies any numbness, tingling, or weakness just the pain.  It is moderate when she is walking but it is absent when she is resting.  On exam, lungs clear and chest nontender.  Patient has no tenderness of the hip, knee, or ankle but does have some tenderness at the base of the right fourth toe.  There is some bruising and swelling but there is no laceration seen.  Good capillary refill and intact sensation and strength.  Good pulses in the foot.  Exam otherwise  unremarkable.  X-rays were obtained and she does have a fracture.  I suspect patient reduced it herself.  Given her otherwise reassuring exam and no persistent dislocation or significant displacement, we do feel she is appropriate for a postop shoe and follow-up with her orthopedist.  She agrees with plan of care and understands return precautions.  She did not want other pain medicine and will use over-the-counter medications.  Patient discharged in good condition.           Final Clinical Impression(s) / ED Diagnoses Final  diagnoses:  Closed displaced fracture of proximal phalanx of lesser toe of right foot, initial encounter    Rx / DC Orders ED Discharge Orders     None       Clinical Impression: 1. Closed displaced fracture of proximal phalanx of lesser toe of right foot, initial encounter     Disposition: Discharge  Condition: Good  I have discussed the results, Dx and Tx plan with the pt(& family if present). He/she/they expressed understanding and agree(s) with the plan. Discharge instructions discussed at great length. Strict return precautions discussed and pt &/or family have verbalized understanding of the instructions. No further questions at time of discharge.    New Prescriptions   No medications on file    Follow Up: your foot doctor         Sydnee Lamour, Gwenyth Allegra, MD 07/11/22 1202

## 2022-07-11 NOTE — ED Triage Notes (Addendum)
Patient arrives ambulatory to triage with complaints of suspected broken right pinky toe. Patient states that she hit it last night and heard it crack.   Rates pain a 2/10.

## 2022-07-23 ENCOUNTER — Ambulatory Visit: Payer: BC Managed Care – PPO | Admitting: Podiatry

## 2022-10-14 ENCOUNTER — Telehealth: Payer: Self-pay

## 2022-10-14 NOTE — Telephone Encounter (Signed)
Returned call to patient who left VM regarding LCS.  No answer.  Left VM and call back info

## 2022-10-15 ENCOUNTER — Other Ambulatory Visit: Payer: Self-pay

## 2022-10-15 DIAGNOSIS — Z122 Encounter for screening for malignant neoplasm of respiratory organs: Secondary | ICD-10-CM

## 2022-10-15 DIAGNOSIS — Z87891 Personal history of nicotine dependence: Secondary | ICD-10-CM

## 2022-11-05 IMAGING — DX DG TOE 5TH 2+V*R*
3 series · 3 of 3 positions shown · non-contrast
Comparison: RIGHT foot radiographs, 12/14/2020

CLINICAL DATA: Injury.  Severe pain.

EXAM:
RIGHT FIFTH TOE

[toe ap]
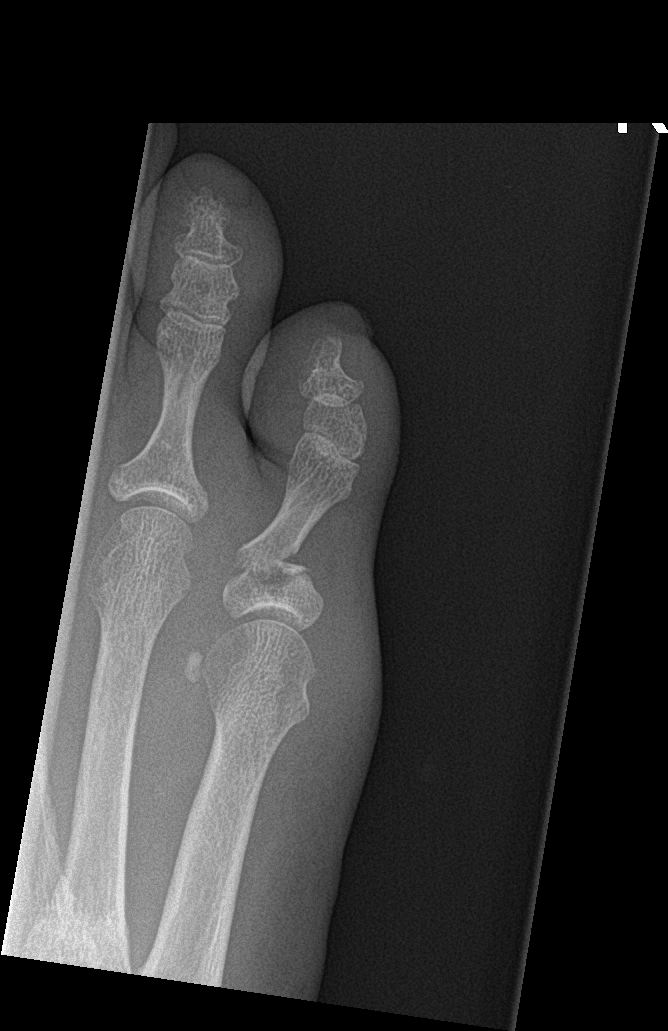

[toe obl]
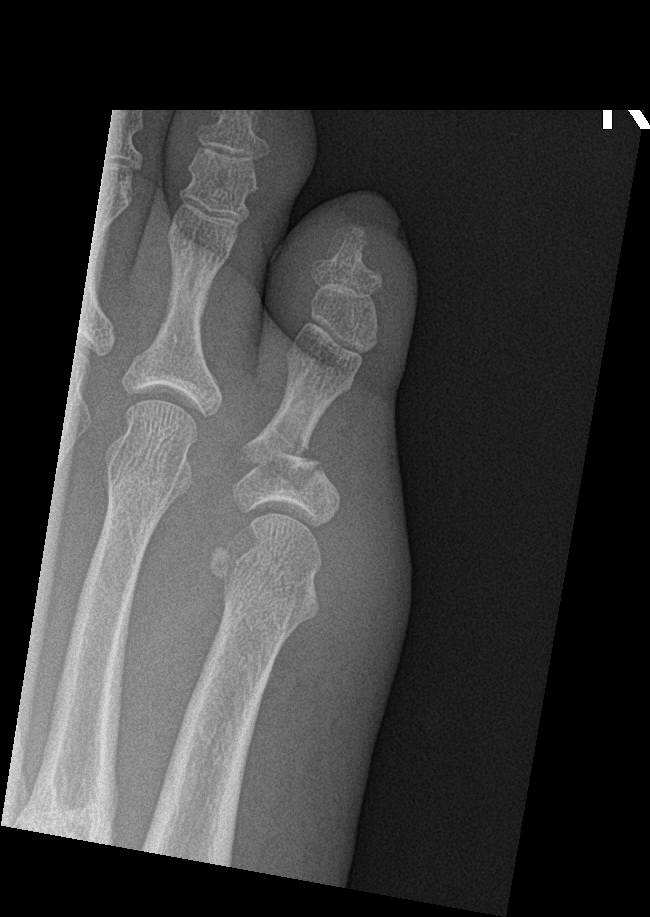

[toe lat]
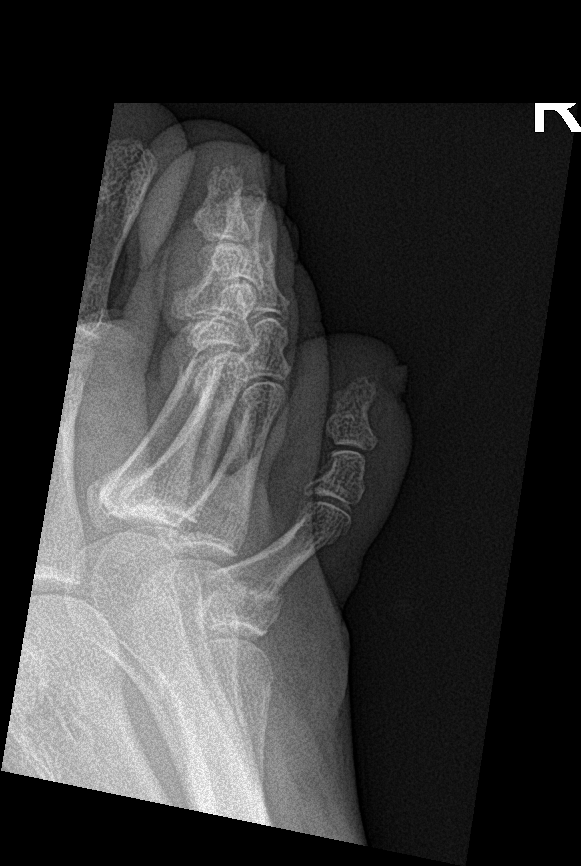

[3 of 3 positions shown; findings below may reference images not displayed]

FINDINGS: RIGHT fifth toe proximal phalanx fracture, involving the
metadiaphysis, with mild inferior medial displacement.

No evidence of intra-articular extension.

Overlying soft tissue swelling.  No soft tissue gas.
IMPRESSION: Mildly displaced, RIGHT fifth toe proximal phalanx fracture, as
above

## 2022-11-24 ENCOUNTER — Encounter: Payer: Self-pay | Admitting: Acute Care

## 2022-11-24 ENCOUNTER — Ambulatory Visit (INDEPENDENT_AMBULATORY_CARE_PROVIDER_SITE_OTHER): Payer: BC Managed Care – PPO | Admitting: Acute Care

## 2022-11-24 DIAGNOSIS — Z87891 Personal history of nicotine dependence: Secondary | ICD-10-CM

## 2022-11-24 NOTE — Patient Instructions (Signed)
Thank you for participating in the [facility]. It was our pleasure to meet you today. We will call you with the results of your scan within the next few days. Your scan will be assigned a Lung RADS category score by the physicians reading the scans.  This Lung RADS score determines follow up scanning.  See below for description of categories, and follow up screening recommendations. We will be in touch to schedule your follow up screening annually or based on recommendations of our providers. We will fax a copy of your scan results to your Primary Care Physician, or the physician who referred you to the program, to ensure they have the results. Please call the office if you have any questions or concerns regarding your scanning experience or results.  Our office number is [phone]. Please speak with Denise Phelps, RN. , or  Denise Buckner RN, They are  our Lung Cancer Screening RN.'s If They are unavailable when you call, Please leave a message on the voice mail. We will return your call at our earliest convenience.This voice mail is monitored several times a day.  Remember, if your scan is normal, we will scan you annually as long as you continue to meet the criteria for the program. (Age 55-77, Current smoker or smoker who has quit within the last 15 years). If you are a smoker, remember, quitting is the single most powerful action that you can take to decrease your risk of lung cancer and other pulmonary, breathing related problems. We know quitting is hard, and we are here to help.  Please let us know if there is anything we can do to help you meet your goal of quitting. If you are a former smoker, congratulations. We are proud of you! Remain smoke free! Remember you can refer friends or family members through the number above.  We will screen them to make sure they meet criteria for the program. Thank you for helping us take better care of you by  participating in Lung Screening.  You can receive free nicotine replacement therapy ( patches, gum or mints) by calling 1-800-QUIT NOW. Please call so we can get you on the path to becoming  a non-smoker. I know it is hard, but you can do this!  Lung RADS Categories:  Lung RADS 1: no nodules or definitely non-concerning nodules.  Recommendation is for a repeat annual scan in 12 months.  Lung RADS 2:  nodules that are non-concerning in appearance and behavior with a very low likelihood of becoming an active cancer. Recommendation is for a repeat annual scan in 12 months.  Lung RADS 3: nodules that are probably non-concerning , includes nodules with a low likelihood of becoming an active cancer.  Recommendation is for a 6-month repeat screening scan. Often noted after an upper respiratory illness. We will be in touch to make sure you have no questions, and to schedule your 6-month scan.  Lung RADS 4 A: nodules with concerning findings, recommendation is most often for a follow up scan in 3 months or additional testing based on our provider's assessment of the scan. We will be in touch to make sure you have no questions and to schedule the recommended 3 month follow up scan.  Lung RADS 4 B:  indicates findings that are concerning. We will be in touch with you to schedule additional diagnostic testing based on our provider's  assessment of the scan.  Other options for assistance in smoking cessation (   As covered by your insurance benefits)  Hypnosis for smoking cessation  [employer] [phone]  Acupuncture for smoking cessation  [facility] [phone]   

## 2022-11-24 NOTE — Progress Notes (Signed)
Virtual Visit via Telephone Note  I connected with Shelly Blackwell on 11/24/22 at  9:00 AM EST by telephone and verified that I am speaking with the correct person using two identifiers.  Location: Patient:  At home Provider:  [place], [place], [place], [address]    I discussed the limitations, risks, security and privacy concerns of performing an evaluation and management service by telephone and the availability of in person appointments. I also discussed with the patient that there may be a patient responsible charge related to this service. The patient expressed understanding and agreed to proceed.    Shared Decision Making Visit Lung Cancer Screening Program [id number])   Eligibility: Age 51 y.o. Pack Years Smoking History Calculation 32 pack year smoking history (# packs/per year x # years smoked) Recent History of coughing up blood  no Unexplained weight loss? no ( >Than 15 pounds within the last 6 months ) Prior History Lung / other cancer no (Diagnosis within the last 5 years already requiring surveillance chest CT Scans). Smoking Status Former Smoker Former Smokers: Years since quit: 5 years  Quit Date:  2018  Visit Components: Discussion included one or more decision making aids. yes Discussion included risk/benefits of screening. yes Discussion included potential follow up diagnostic testing for abnormal scans. yes Discussion included meaning and risk of over diagnosis. yes Discussion included meaning and risk of False Positives. yes Discussion included meaning of total radiation exposure. yes  Counseling Included: Importance of adherence to annual lung cancer LDCT screening. yes Impact of comorbidities on ability to participate in the program. yes Ability and willingness to under diagnostic treatment. yes  Smoking Cessation Counseling: Current Smokers:  Discussed importance of smoking cessation. yes Information about tobacco cessation classes and  interventions provided to patient. yes Patient provided with "ticket" for LDCT Scan. yes Symptomatic Patient. no  Counseling NA Diagnosis Code: Tobacco Use Z72.0 Asymptomatic Patient yes  Counseling (Intermediate counseling: > three minutes counseling) P3790 Former Smokers:  Discussed the importance of maintaining cigarette abstinence. yes Diagnosis Code: Personal History of Nicotine Dependence. W40.973 Information about tobacco cessation classes and interventions provided to patient. Yes Patient provided with "ticket" for LDCT Scan. yes Written Order for Lung Cancer Screening with LDCT placed in Epic. Yes (CT Chest Lung Cancer Screening Low Dose W/O CM) [id number] Z12.2-Screening of respiratory organs Z87.891-Personal history of nicotine dependence  I spent 25 minutes of face to face time/virtual visit time  with  Shelly Blackwell discussing the risks and benefits of lung cancer screening. We took the time to pause the power point at intervals to allow for questions to be asked and answered to ensure understanding. We discussed that she had taken the single most powerful action possible to decrease her risk of developing lung cancer when she quit smoking. I counseled her to remain smoke free, and to contact me if she ever had the desire to smoke again so that I can provide resources and tools to help support the effort to remain smoke free. We discussed the time and location of the scan, and that either  Shelly Glassman RN, Shelly Prince, RN or I  or I will call / send a letter with the results within  24-72 hours of receiving them. She has the office contact information in the event she needs to speak with me,  she verbalized understanding of all of the above and had no further questions upon leaving the office.     I explained to the patient  that there has been a high incidence of coronary artery disease noted on these exams. I explained that this is a non-gated exam therefore degree or severity  cannot be determined. This patient is not on statin therapy. I have asked the patient to follow-up with their PCP regarding any incidental finding of coronary artery disease and management with diet or medication as they feel is clinically indicated. The patient verbalized understanding of the above and had no further questions.     Shelly Spatz, NP 11/24/2022

## 2022-12-01 ENCOUNTER — Ambulatory Visit (HOSPITAL_BASED_OUTPATIENT_CLINIC_OR_DEPARTMENT_OTHER): Admission: RE | Admit: 2022-12-01 | Payer: BC Managed Care – PPO | Source: Ambulatory Visit

## 2022-12-04 ENCOUNTER — Other Ambulatory Visit: Payer: Self-pay | Admitting: Obstetrics and Gynecology

## 2022-12-04 DIAGNOSIS — F341 Dysthymic disorder: Secondary | ICD-10-CM

## 2022-12-08 NOTE — Telephone Encounter (Signed)
Medication refill request: Celexa '20mg'$  Last AEX:  12/02/21 Next AEX: 12/28/22 Last MMG (if hormonal medication request): n/a Refill authorized: #30 with 0 rf to get her to her AEX

## 2022-12-28 ENCOUNTER — Other Ambulatory Visit (HOSPITAL_COMMUNITY)
Admission: RE | Admit: 2022-12-28 | Discharge: 2022-12-28 | Disposition: A | Discharge status: Home / Self Care | Payer: BLUE CROSS/BLUE SHIELD | Source: Ambulatory Visit | Attending: Obstetrics and Gynecology | Admitting: Obstetrics and Gynecology

## 2022-12-28 ENCOUNTER — Ambulatory Visit (INDEPENDENT_AMBULATORY_CARE_PROVIDER_SITE_OTHER): Payer: BLUE CROSS/BLUE SHIELD | Admitting: Obstetrics and Gynecology

## 2022-12-28 ENCOUNTER — Encounter: Payer: Self-pay | Admitting: Obstetrics and Gynecology

## 2022-12-28 VITALS — BP 118/80 | HR 74 | Ht 67.0 in | Wt 204.0 lb

## 2022-12-28 DIAGNOSIS — Z23 Encounter for immunization: Secondary | ICD-10-CM | POA: Diagnosis not present

## 2022-12-28 DIAGNOSIS — F341 Dysthymic disorder: Secondary | ICD-10-CM

## 2022-12-28 DIAGNOSIS — Z124 Encounter for screening for malignant neoplasm of cervix: Secondary | ICD-10-CM

## 2022-12-28 DIAGNOSIS — Z01419 Encounter for gynecological examination (general) (routine) without abnormal findings: Secondary | ICD-10-CM

## 2022-12-28 DIAGNOSIS — Z1272 Encounter for screening for malignant neoplasm of vagina: Secondary | ICD-10-CM | POA: Diagnosis present

## 2022-12-28 MED ORDER — CITALOPRAM HYDROBROMIDE 20 MG PO TABS: ORAL_TABLET | ORAL | 3 refills | Status: DC

## 2022-12-28 NOTE — Patient Instructions (Signed)

## 2022-12-28 NOTE — Progress Notes (Addendum)
52 y.o. G38P2002 Divorced White or Caucasian Not Hispanic or Latino female here for annual exam.  No vaginal bleeding. Not sexually active.    Pap from 10/28/21 returned as ASCUS, +HPV. No prior h/o abnormal paps. Colposcopy on 12/02/21 was unsatisfactory, vaginal biopsy returned with VAIN I, ECC with atypical squamous epithelium, negative testing for hpv 16.    She is on Celexa and doing well. Considering going off.   She is raising her 61 year old grandson. Mom has vascular dementia, still living independently. Kadence is helping her.   Patient's last menstrual period was 02/11/2019.          Sexually active: No.  The current method of family planning is post menopausal status.    Exercising:   walk Smoker:  no  Health Maintenance: Pap:  10/28/2021 ASCUS/HPV positive  History of abnormal Pap:  yes, Colpo 12/22 with VAIN I MMG:  07/01/2022 BI-RADS CATEGORY 1: Negative.  BMD:   n/a Colonoscopy: 05/2021, f/u 10 years.  TDaP:  05/2013 Gardasil: n/a   reports that she quit smoking about 5 years ago. Her smoking use included cigarettes. She has a 32.00 pack-year smoking history. She has never used smokeless tobacco. She reports that she does not drink alcohol and does not use drugs. Working at [employer] job, Therapist, art, Press photographer.  2 grown daughters. Older daughter is in [place], bipolar. Younger daughter is a Paramedic at [employer], living at home.    She adopted her grandson, 87 years old. She also has a young granddaughter who lives with her Dad. They just got a lab puppy.  Past Medical History:  Diagnosis Date   Depression    Gastritis    GERD (gastroesophageal reflux disease)    History of palpitations    Kidney stones    h/o-no surgery    Past Surgical History:  Procedure Laterality Date   AUGMENTATION MAMMAPLASTY Bilateral    2008   BREAST ENHANCEMENT SURGERY  2009   BUNIONECTOMY  1989, 1998   left foot   CERVICAL [place] ARTHROPLASTY  09/18/2021   [place], 2001   x2    ESOPHAGEAL MANOMETRY N/A 09/19/2017   Procedure: ESOPHAGEAL MANOMETRY (EM);  Surgeon: Mauri Pole, MD;  [facility];  Service: Endoscopy;  Laterality: N/A;   LAPAROSCOPIC CHOLECYSTECTOMY  [id number]   UMBILICAL HERNIA REPAIR N/A 11/01/2013   Procedure: HERNIA REPAIR UMBILICAL ADULT;  Surgeon: Adin Hector, MD;  Location: [facility];  Service: General;  Laterality: N/A;    Current Outpatient Medications  Medication Sig Dispense Refill   acetaminophen (TYLENOL) 500 MG tablet      citalopram (CELEXA) 20 MG tablet Take 1 tablet by mouth once daily 30 tablet 0   ibuprofen (ADVIL,MOTRIN) 400 MG tablet Take 1-2 tablets (400-800 mg total) by mouth every 6 (six) hours as needed for fever, headache, mild pain, moderate pain or cramping. 40 tablet 1   Multiple Vitamins-Minerals (MULTIVITAMIN PO) Take by mouth daily.     Omega-3 Fatty Acids (FISH OIL) 1000 MG CAPS Take by mouth. (Patient not taking: Reported on 12/28/2022)     Probiotic Product (PROBIOTIC-10 PO) Take by mouth. (Patient not taking: Reported on 12/28/2022)     No current facility-administered medications for this visit.    Family History  Problem Relation Age of Onset   Hepatitis Mother    Liver cancer Mother    Cirrhosis Mother        liver transplant   Kidney Stones Mother  Lung cancer Father 57       brain metastasis, being treated at [facility]   Esophageal cancer Paternal Uncle    Alcoholism Maternal Grandfather    Stomach cancer Paternal Grandmother            Alcoholism Paternal Grandfather    Breast cancer Neg Hx    Colon cancer Neg Hx    Rectal cancer Neg Hx     Review of Systems  All other systems reviewed and are negative.   Exam:   BP 118/80 (BP Location: Right Arm, Patient Position: Sitting, Cuff Size: Normal)   Pulse 74   Ht '5\' 7"'$  (1.702 m)   Wt 204 lb (92.5 kg)   LMP 02/11/2019   BMI 31.95 kg/m   Weight change: '@WEIGHTCHANGE'$ @ Height:   Height: '5\' 7"'$  (170.2 cm)  Ht Readings from Last 3  Encounters:  12/28/22 '5\' 7"'$  (1.702 m)  06/02/22 '5\' 7"'$  (1.702 m)  04/16/22 '5\' 7"'$  (1.702 m)    General appearance: alert, cooperative and appears stated age Head: Normocephalic, without obvious abnormality, atraumatic Neck: no adenopathy, supple, symmetrical, trachea midline and thyroid normal to inspection and palpation Lungs: clear to auscultation bilaterally Cardiovascular: regular rate and rhythm Breasts: normal appearance, no masses or tenderness Abdomen: soft, non-tender; non distended,  no masses,  no organomegaly Extremities: extremities normal, atraumatic, no cyanosis or edema Skin: Skin color, texture, turgor normal. No rashes or lesions Lymph nodes: Cervical, supraclavicular, and axillary nodes normal. No abnormal inguinal nodes palpated Neurologic: Grossly normal   Pelvic: External genitalia:  no lesions              Urethra:  normal appearing urethra with no masses, tenderness or lesions              Bartholins and Skenes: normal                 Vagina: normal appearing vagina with normal color and discharge, no lesions              Cervix: no lesions               Bimanual Exam:  Uterus:  normal size, contour, position, consistency, mobility, non-tender              Adnexa: no mass, fullness, tenderness               Rectovaginal: Confirms               Anus:  normal sphincter tone, no lesions  Kimalexis, RMA chaperoned for the exam.  1. Well woman exam Discussed breast self exam Discussed calcium and vit D intake Colonoscopy utd Mammogram in 7/24 Labs are UTD  2. Screening for cervical cancer - Cytology - PAP  3. Screening for vaginal cancer - Cytology - PAP  4. Dysthymia - citalopram (CELEXA) 20 MG tablet; Take 1 tablet by mouth once daily  Dispense: 90 tablet; Refill: 3  5. Immunization due - Tdap vaccine greater than or equal to 7yo IM   Addendum: cervical stenosis noted. If she needs a colposcopy will pretreat with cytotec.

## 2022-12-31 LAB — CYTOLOGY - PAP
Comment: NEGATIVE
Diagnosis: UNDETERMINED — AB
High risk HPV: NEGATIVE

## 2023-01-04 ENCOUNTER — Encounter: Payer: Self-pay | Admitting: Obstetrics and Gynecology

## 2023-01-10 ENCOUNTER — Ambulatory Visit (HOSPITAL_BASED_OUTPATIENT_CLINIC_OR_DEPARTMENT_OTHER)
Admission: RE | Admit: 2023-01-10 | Discharge: 2023-01-10 | Disposition: A | Discharge status: Home / Self Care | Payer: BLUE CROSS/BLUE SHIELD | Source: Ambulatory Visit | Attending: Acute Care | Admitting: Acute Care

## 2023-01-10 ENCOUNTER — Other Ambulatory Visit: Payer: Self-pay | Admitting: Radiology

## 2023-01-10 DIAGNOSIS — Z87891 Personal history of nicotine dependence: Secondary | ICD-10-CM | POA: Diagnosis present

## 2023-01-10 DIAGNOSIS — Z122 Encounter for screening for malignant neoplasm of respiratory organs: Secondary | ICD-10-CM

## 2023-01-10 DIAGNOSIS — F341 Dysthymic disorder: Secondary | ICD-10-CM

## 2023-01-12 ENCOUNTER — Other Ambulatory Visit: Payer: Self-pay

## 2023-01-12 DIAGNOSIS — Z87891 Personal history of nicotine dependence: Secondary | ICD-10-CM

## 2023-01-12 DIAGNOSIS — Z122 Encounter for screening for malignant neoplasm of respiratory organs: Secondary | ICD-10-CM

## 2023-01-13 NOTE — Telephone Encounter (Signed)
I sent in Celexa 20 mg, #90 with 3 refills on 12/28/22 to the [facility] on [address]. If she is asking for it to go to another pharmacy, please send in 90 with 3 refills.

## 2023-01-13 NOTE — Telephone Encounter (Signed)
RF request received for Celexa '20mg'$  #30.  Last AEX 12/28/22.

## 2023-01-19 ENCOUNTER — Other Ambulatory Visit: Payer: Self-pay

## 2023-01-19 NOTE — Telephone Encounter (Signed)
Patient has changed pharmacy to [facility].  RF request for Citalopram '20mg'$  #30.  Last AEX

## 2023-01-20 ENCOUNTER — Other Ambulatory Visit: Payer: Self-pay | Admitting: Obstetrics and Gynecology

## 2023-01-20 DIAGNOSIS — R87610 Atypical squamous cells of undetermined significance on cytologic smear of cervix (ASC-US): Secondary | ICD-10-CM

## 2023-02-03 ENCOUNTER — Ambulatory Visit: Payer: Medicaid Other | Admitting: Obstetrics and Gynecology

## 2023-02-03 ENCOUNTER — Other Ambulatory Visit (HOSPITAL_COMMUNITY)
Admission: RE | Admit: 2023-02-03 | Discharge: 2023-02-03 | Disposition: A | Discharge status: Home / Self Care | Payer: Medicaid Other | Source: Ambulatory Visit | Attending: Obstetrics and Gynecology | Admitting: Obstetrics and Gynecology

## 2023-02-03 ENCOUNTER — Encounter: Payer: Self-pay | Admitting: Obstetrics and Gynecology

## 2023-02-03 VITALS — BP 124/68 | HR 63 | Wt 206.0 lb

## 2023-02-03 DIAGNOSIS — R87610 Atypical squamous cells of undetermined significance on cytologic smear of cervix (ASC-US): Secondary | ICD-10-CM

## 2023-02-03 DIAGNOSIS — Z87411 Personal history of vaginal dysplasia: Secondary | ICD-10-CM

## 2023-02-03 NOTE — Patient Instructions (Signed)
Colposcopy Post-procedure Instructions Cramping is common.  You may take Ibuprofen, Aleve, or Tylenol for the cramping.  This should resolve within the next two to three days.   You may have bright red spotting or blackish discharge for several days after your procedure.  The discharge occurs because of a topical solution used to stop bleeding at the biopsy site(s).  You should wear a mini pad for the next few days. You need to call the office if you have any pelvic pain, fever, heavy bleeding, or foul smelling vaginal discharge. Shower or bathe as normal You will be notified within one week of your biopsy results or we will discuss your results at your follow-up appointment if needed.

## 2023-02-03 NOTE — Progress Notes (Signed)
GYNECOLOGY  VISIT   HPI: 52 y.o.   Divorced White or Caucasian Not Hispanic or Latino  female   [phone] with Patient's last menstrual period was 02/11/2019.   here for Colposcopy.   H/O VAIN I in 12/22, ECC with atypical squamous epithelium with negative testing for p16 (HPV).  Recent pap with ASCUS, negative hpv  GYNECOLOGIC HISTORY: Patient's last menstrual period was 02/11/2019. Contraception:pmp  Menopausal hormone therapy: none         OB History     Gravida  2   Para  2   Term  2   Preterm      AB      Living  2      SAB      IAB      Ectopic      Multiple      Live Births                 Patient Active Problem List   Diagnosis Date Noted   HNP (herniated nucleus pulposus), cervical 08/05/2021   Body mass index (BMI) 32.0-32.9, adult 07/17/2021   Cervical spondylosis 06/25/2021   Elevated blood-pressure reading, without diagnosis of hypertension 06/25/2021   Cervical radiculopathy 06/22/2021   Pain in lower limb 05/21/2021   Neck pain 10/06/2020   Vitamin D deficiency 01/09/2019   Dysphagia    Laryngopharyngeal reflux 09/23/2014   Incisional hernia - periumbilical [id number]   PVC's (premature ventricular contractions) 09/14/2013    Past Medical History:  Diagnosis Date   Depression    Gastritis    GERD (gastroesophageal reflux disease)    History of palpitations    Kidney stones    h/o-no surgery    Past Surgical History:  Procedure Laterality Date   AUGMENTATION MAMMAPLASTY Bilateral    2008   BREAST ENHANCEMENT SURGERY  2009   BUNIONECTOMY  1989, 1998   left foot   CERVICAL [place] ARTHROPLASTY  09/18/2021   [place], 2001   x2   ESOPHAGEAL MANOMETRY N/A 09/19/2017   Procedure: ESOPHAGEAL MANOMETRY (EM);  Surgeon: Mauri Pole, MD;  [facility];  Service: Endoscopy;  Laterality: N/A;   LAPAROSCOPIC CHOLECYSTECTOMY  [id number]   UMBILICAL HERNIA REPAIR N/A 11/01/2013   Procedure: HERNIA REPAIR  UMBILICAL ADULT;  Surgeon: Adin Hector, MD;  [facility]: [facility];  Service: General;  Laterality: N/A;    Current Outpatient Medications  Medication Sig Dispense Refill   acetaminophen (TYLENOL) 500 MG tablet      citalopram (CELEXA) 20 MG tablet Take 1 tablet by mouth once daily 90 tablet 3   ibuprofen (ADVIL,MOTRIN) 400 MG tablet Take 1-2 tablets (400-800 mg total) by mouth every 6 (six) hours as needed for fever, headache, mild pain, moderate pain or cramping. 40 tablet 1   Multiple Vitamins-Minerals (MULTIVITAMIN PO) Take by mouth daily.     Omega-3 Fatty Acids (FISH OIL) 1000 MG CAPS Take by mouth.     Probiotic Product (PROBIOTIC-10 PO) Take by mouth.     No current facility-administered medications for this visit.     ALLERGIES: Nickel  Family History  Problem Relation Age of Onset   Hepatitis Mother    Liver cancer Mother    Cirrhosis Mother        liver transplant   Kidney Stones Mother    Lung cancer Father 19       brain metastasis, being treated at [facility]   Esophageal cancer Paternal Uncle  Alcoholism Maternal Grandfather    Stomach cancer Paternal Grandmother            Alcoholism Paternal Grandfather    Breast cancer Neg Hx    Colon cancer Neg Hx    Rectal cancer Neg Hx     Social History   Socioeconomic History   Marital status: Divorced    Spouse name: n/a   Number of children: 2   Years of education: 15   Highest education level: Not on file  Occupational History   Occupation: Pharmacist, community: [place]  Tobacco Use   Smoking status: Former    Packs/day: 1.00    Years: 32.00    Total pack years: 32.00    Types: Cigarettes    Quit date: 08/08/2017    Years since quitting: 5.4   Smokeless tobacco: Never   Tobacco comments:    quit 07/2017, but uses e cig  Vaping Use   Vaping Use: Every day   Devices: occasionally  Substance and Sexual Activity   Alcohol use: No    Alcohol/week: 0.0 standard drinks of  alcohol   Drug use: No   Sexual activity: Yes    Partners: Male    Birth control/protection: None    Comment: Partner- vasectomy   Other Topics Concern   Not on file  Social History Narrative   Lives with her youngest daughter. Her older daughter lives in [place], [place].   Social Determinants of Health   Financial Resource Strain: Not on file  Food Insecurity: Not on file  Transportation Needs: Not on file  Physical Activity: Not on file  Stress: Not on file  Social Connections: Not on file  Intimate Partner Violence: Not on file    Review of Systems  All other systems reviewed and are negative.   PHYSICAL EXAMINATION:    BP 124/68   Pulse 63   Wt 206 lb (93.4 kg)   LMP 02/11/2019   SpO2 98%   BMI 32.26 kg/m     General appearance: alert, cooperative and appears stated age  Pelvic: External genitalia:  no lesions              Urethra:  normal appearing urethra with no masses, tenderness or lesions              Bartholins and Skenes: normal                 Vagina: normal appearing vagina with normal color and discharge, no lesions              Cervix: no lesions and stenotic  Colposcopy: unsatisfactory, no aceto-white changes. Negative lugols examination of the cervix and upper vagina. The vagina is atrophic and has generalized decreased lugols uptake, no abnormalities seen.  The cervix is stenotic. A tenaculum was placed and the cervix was dilated with the os finder. The ECC was performed. The tenaculum was removed. The rest of the upper vaginal was evaluated with lugols as the speculum was removed.          Chaperone was present for exam.  1. Atypical squamous cells of undetermined significance (ASCUS) on Papanicolaou smear of cervix - Surgical pathology( [facility]/ [facility])  2. History of vaginal dysplasia No signs of vaginal dysplasia on colposcopy

## 2023-02-08 ENCOUNTER — Other Ambulatory Visit: Payer: Self-pay

## 2023-02-08 DIAGNOSIS — R87610 Atypical squamous cells of undetermined significance on cytologic smear of cervix (ASC-US): Secondary | ICD-10-CM

## 2023-02-08 DIAGNOSIS — Z87411 Personal history of vaginal dysplasia: Secondary | ICD-10-CM

## 2023-02-08 LAB — SURGICAL PATHOLOGY

## 2023-02-08 MED ORDER — MISOPROSTOL 200 MCG PO TABS: ORAL_TABLET | ORAL | 0 refills | Status: AC

## 2023-02-09 ENCOUNTER — Other Ambulatory Visit: Payer: Self-pay

## 2023-02-09 DIAGNOSIS — R87610 Atypical squamous cells of undetermined significance on cytologic smear of cervix (ASC-US): Secondary | ICD-10-CM

## 2023-02-09 DIAGNOSIS — Z87411 Personal history of vaginal dysplasia: Secondary | ICD-10-CM

## 2023-03-14 ENCOUNTER — Other Ambulatory Visit: Payer: Self-pay

## 2023-03-14 DIAGNOSIS — R87619 Unspecified abnormal cytological findings in specimens from cervix uteri: Secondary | ICD-10-CM

## 2023-03-14 DIAGNOSIS — Z87411 Personal history of vaginal dysplasia: Secondary | ICD-10-CM

## 2023-03-15 ENCOUNTER — Ambulatory Visit: Payer: Medicaid Other | Admitting: Obstetrics and Gynecology

## 2023-03-15 NOTE — Progress Notes (Deleted)
GYNECOLOGY  VISIT   HPI: 52 y.o.   Divorced White or Caucasian Not Hispanic or Latino  female   [phone] with Patient's last menstrual period was 02/11/2019.   here for a repeat ECC. Recent colposcopy for an ASCUS/neg HPV pap returned with a benign but limited ECC sample. She had cervical stenosis and required cervical dilation to obtain the ECC.  In 12/22 she had VAIN I and an ECC with atypical squamous epithelium with negative testing for p16 (HPV).   She has been pretreated with cytotec.   GYNECOLOGIC HISTORY: Patient's last menstrual period was 02/11/2019. Contraception:PMP Menopausal hormone therapy: none        OB History     Gravida  2   Para  2   Term  2   Preterm      AB      Living  2      SAB      IAB      Ectopic      Multiple      Live Births                 Patient Active Problem List   Diagnosis Date Noted   HNP (herniated nucleus pulposus), cervical 08/05/2021   Body mass index (BMI) 32.0-32.9, adult 07/17/2021   Cervical spondylosis 06/25/2021   Elevated blood-pressure reading, without diagnosis of hypertension 06/25/2021   Cervical radiculopathy 06/22/2021   Pain in lower limb 05/21/2021   Neck pain 10/06/2020   Vitamin D deficiency 01/09/2019   Dysphagia    Laryngopharyngeal reflux 09/23/2014   Incisional hernia - periumbilical [id number]   PVC's (premature ventricular contractions) 09/14/2013    Past Medical History:  Diagnosis Date   Depression    Gastritis    GERD (gastroesophageal reflux disease)    History of palpitations    Kidney stones    h/o-no surgery    Past Surgical History:  Procedure Laterality Date   AUGMENTATION MAMMAPLASTY Bilateral    2008   BREAST ENHANCEMENT SURGERY  2009   BUNIONECTOMY  1989, 1998   left foot   CERVICAL [place] ARTHROPLASTY  09/18/2021   [place], 2001   x2   ESOPHAGEAL MANOMETRY N/A 09/19/2017   Procedure: ESOPHAGEAL MANOMETRY (EM);  Surgeon: Mauri Pole, MD;   [facility];  Service: Endoscopy;  Laterality: N/A;   LAPAROSCOPIC CHOLECYSTECTOMY  [id number]   UMBILICAL HERNIA REPAIR N/A 11/01/2013   Procedure: HERNIA REPAIR UMBILICAL ADULT;  Surgeon: Adin Hector, MD;  Location: [facility];  Service: General;  Laterality: N/A;    Current Outpatient Medications  Medication Sig Dispense Refill   misoprostol (CYTOTEC) 200 MCG tablet S: Place 2 tablets vaginally 6-12 hours prior to the appointment. 2 tablet 0   acetaminophen (TYLENOL) 500 MG tablet      citalopram (CELEXA) 20 MG tablet Take 1 tablet by mouth once daily 90 tablet 3   ibuprofen (ADVIL,MOTRIN) 400 MG tablet Take 1-2 tablets (400-800 mg total) by mouth every 6 (six) hours as needed for fever, headache, mild pain, moderate pain or cramping. 40 tablet 1   Multiple Vitamins-Minerals (MULTIVITAMIN PO) Take by mouth daily.     Omega-3 Fatty Acids (FISH OIL) 1000 MG CAPS Take by mouth.     Probiotic Product (PROBIOTIC-10 PO) Take by mouth.     No current facility-administered medications for this visit.     ALLERGIES: Nickel  Family History  Problem Relation Age of Onset   Hepatitis Mother  Liver cancer Mother    Cirrhosis Mother        liver transplant   Kidney Stones Mother    Lung cancer Father 59       brain metastasis, being [facility] at [facility]   Esophageal cancer Paternal Uncle    Alcoholism Maternal Grandfather    Stomach cancer Paternal Grandmother            Alcoholism Paternal Grandfather    Breast cancer Neg Hx    Colon cancer Neg Hx    Rectal cancer Neg Hx     Social History   Socioeconomic History   Marital status: Divorced    Spouse name: n/a   Number of children: 2   Years of education: 15   Highest education level: Not on file  Occupational History   Occupation: Pharmacist, 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[place]  Tobacco Use   Smoking status: Former    Packs/day: 1.00    Years: 32.00    Additional pack years: 0.00    Total pack years:  32.00    Types: Cigarettes    Quit date: 08/08/2017    Years since quitting: 5.6   Smokeless tobacco: Never   Tobacco comments:    quit 07/2017, but uses e cig  Vaping Use   Vaping Use: Every day   Devices: occasionally  Substance and Sexual Activity   Alcohol use: No    Alcohol/week: 0.0 standard drinks of alcohol   Drug use: No   Sexual activity: Yes    Partners: Male    Birth control/protection: None    Comment: Partner- vasectomy   Other Topics Concern   Not on file  Social History Narrative   Lives with her youngest daughter. Her older daughter lives in [place], [place].   Social Determinants of Health   Financial Resource Strain: Not on file  Food Insecurity: Not on file  Transportation Needs: Not on file  Physical Activity: Not on file  Stress: Not on file  Social Connections: Not on file  Intimate Partner Violence: Not on file    ROS  PHYSICAL EXAMINATION:    LMP 02/11/2019     General appearance: alert, cooperative and appears stated age   Pelvic: External genitalia:  no lesions              Urethra:  normal appearing urethra with no masses, tenderness or lesions              Bartholins and Skenes: normal                 Vagina: normal appearing vagina with normal color and discharge, no lesions              Cervix: {CHL AMB PHY EX CERVIX NORM DEFAULT:[phone]::"no lesions"}               Chaperone was present for exam.

## 2023-03-31 ENCOUNTER — Telehealth: Payer: Self-pay

## 2023-03-31 NOTE — Telephone Encounter (Signed)
Call placed to patient to get her rescheduled for her ECC with Dr. Oscar La. Also inquiring if patient needs a new rx for Cytotec.

## 2023-07-22 IMAGING — CT CT ABD-PELV W/ CM
2 of 5 series · 15 of 46 positions shown, 17 images · IV contrast (agent unspecified)
Comparison: No prior CT.  Ultrasound 02/05/2021 reviewed

CLINICAL DATA: Acute abdominal pain. Patient reports epigastric
pain radiating to the left.

EXAM:
CT ABDOMEN AND PELVIS WITH CONTRAST
TECHNIQUE: Multidetector CT imaging of the abdomen and pelvis was performed
using the standard protocol following bolus administration of
intravenous contrast.

[Series 2: abd pel w · axial · 0.71mm/px · z∈[+368,+788]mm · 12 of 94 slices shown, 14 images]
[im 5/94  soft-tissue]
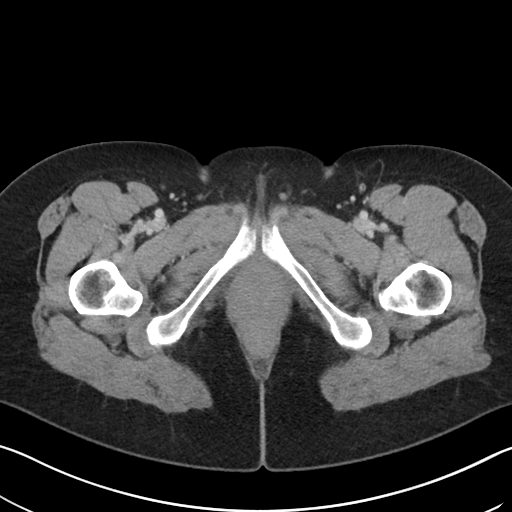
[im 5/94  bone]
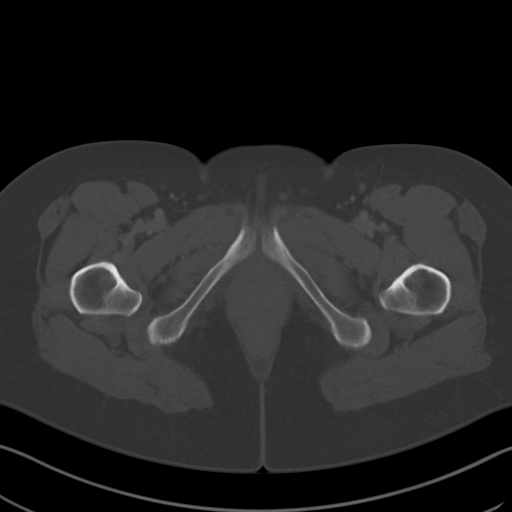
[im 15/94  soft-tissue]
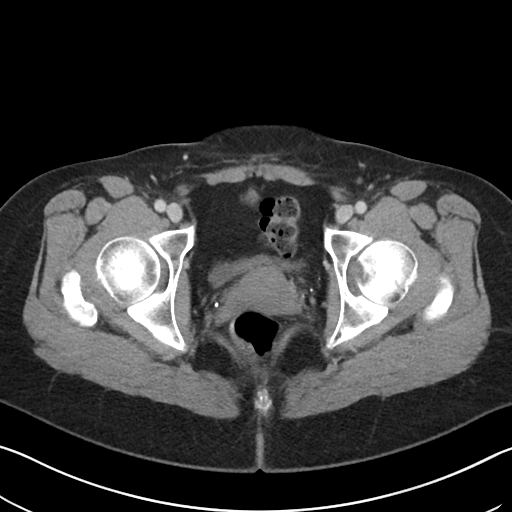
[im 20/94  soft-tissue]
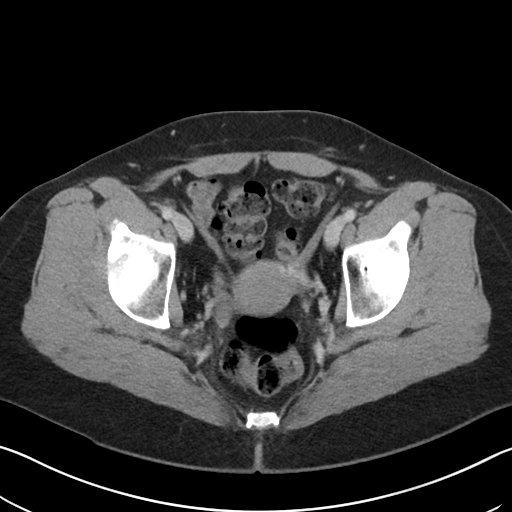
[im 30/94  soft-tissue]
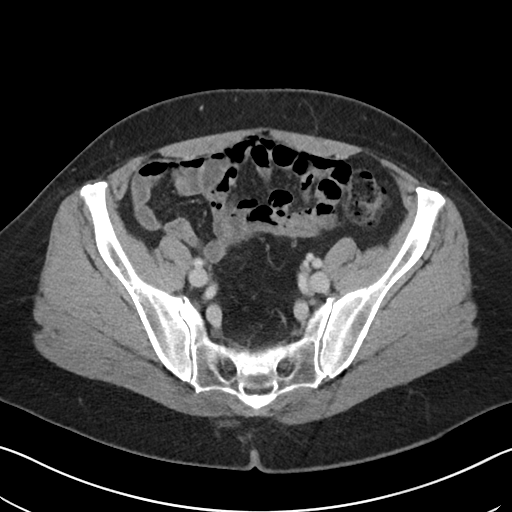
[im 35/94  soft-tissue]
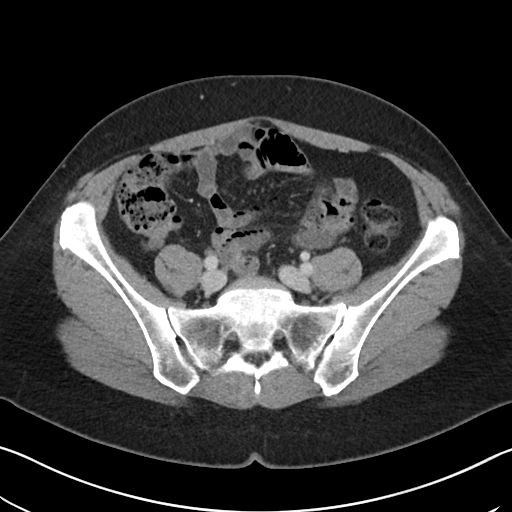
[im 45/94  soft-tissue]
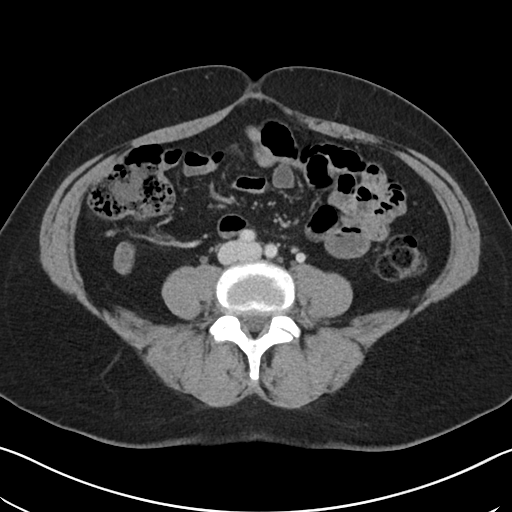
[im 49/94  soft-tissue]
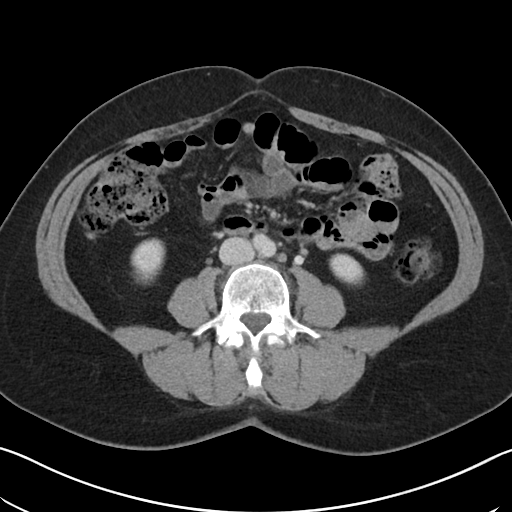
[im 59/94  soft-tissue]
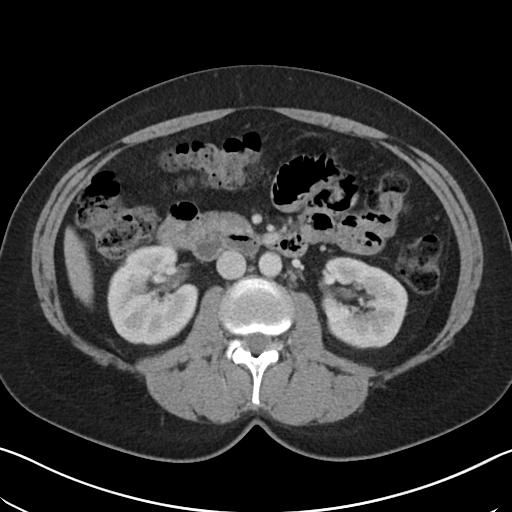
[im 64/94  soft-tissue]
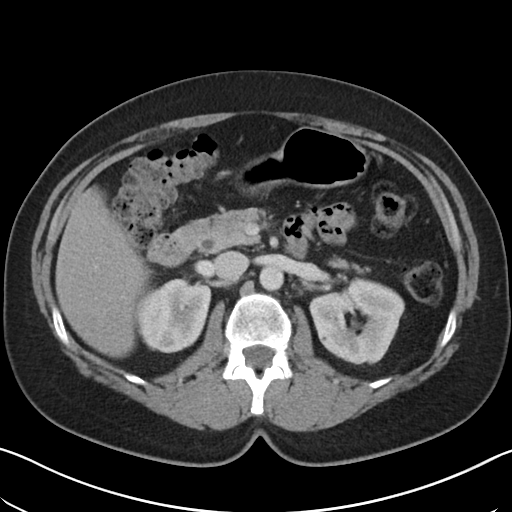
[im 64/94  bone]
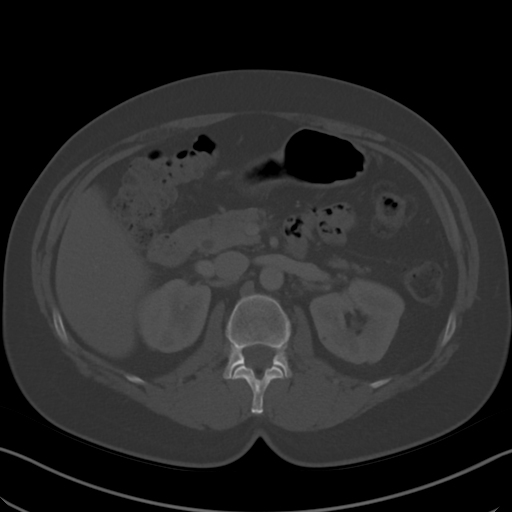
[im 74/94  soft-tissue]
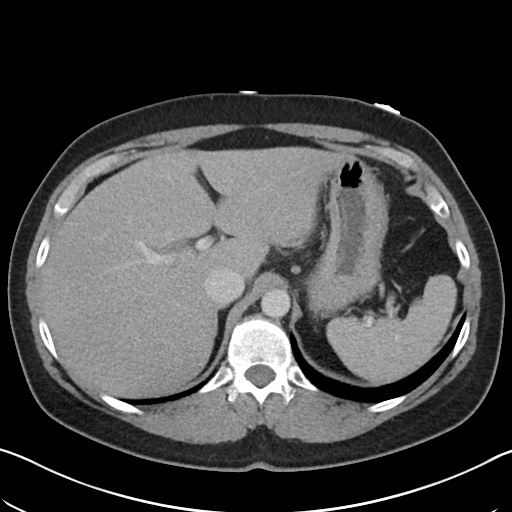
[im 79/94  soft-tissue]
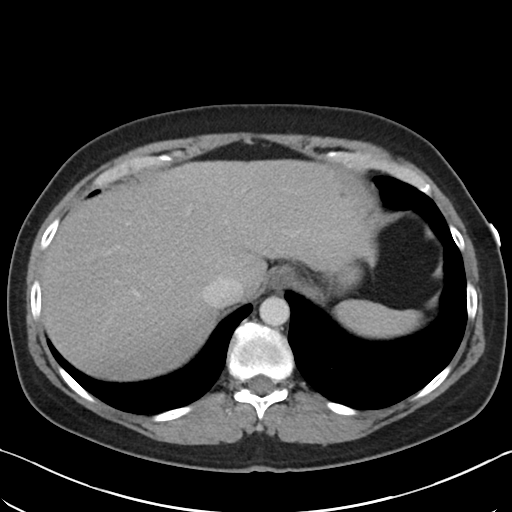
[im 89/94  soft-tissue]
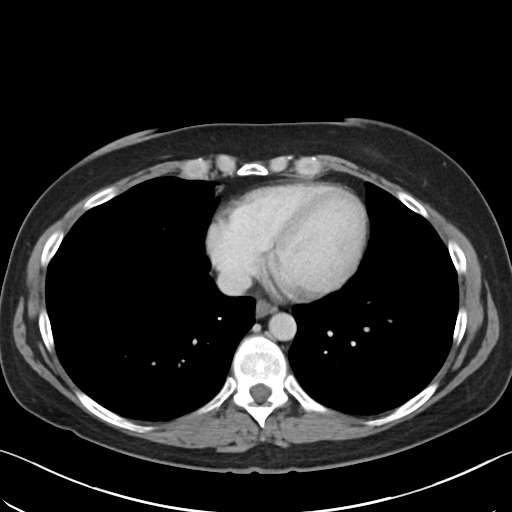

[Series 5: coronal · coronal · 0.70mm/px · 3 of 96 slices shown]
[im 32/96  soft-tissue]
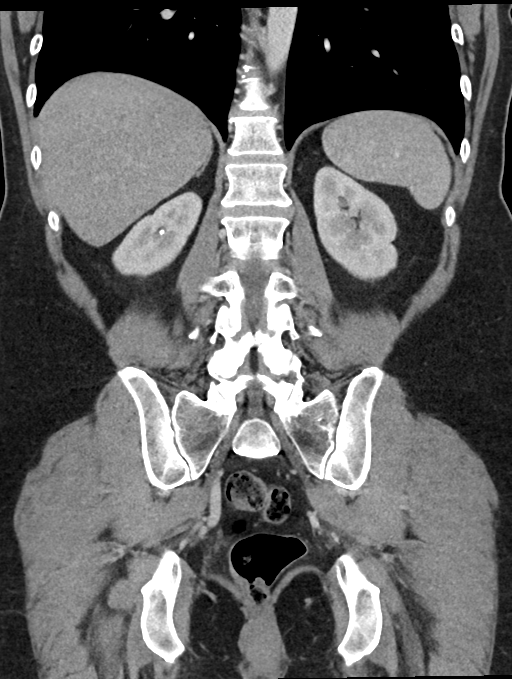
[im 43/96  soft-tissue]
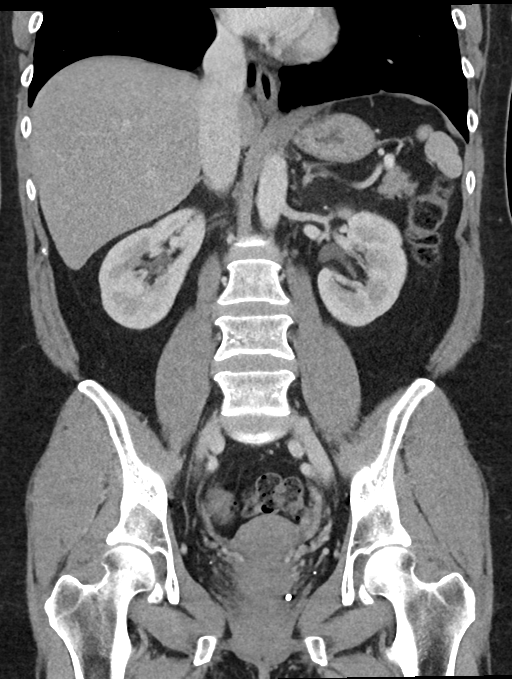
[im 53/96  soft-tissue]
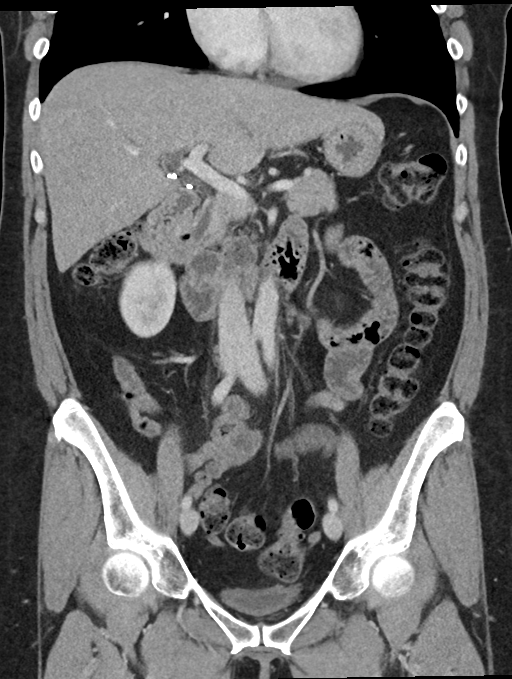

[15 of 46 positions shown; findings below may reference images not displayed]

RADIATION DOSE REDUCTION: This exam was performed according to the
departmental dose-optimization program which includes automated
exposure control, adjustment of the mA and/or kV according to
patient size and/or use of iterative reconstruction technique.

CONTRAST:  100mL OMNIPAQUE IOHEXOL 300 MG/ML  SOLN
FINDINGS: Lower chest: No acute airspace disease or pleural effusion. The
heart is normal in size.

Hepatobiliary: No focal liver abnormality is seen. Status post
cholecystectomy. No biliary dilatation.

Pancreas: No ductal dilatation or inflammation.

Spleen: Normal in size without focal abnormality.

Adrenals/Urinary Tract: Normal adrenal glands. No hydronephrosis or
perinephric edema. Homogeneous renal enhancement with symmetric
excretion on delayed phase imaging. There are small nonobstructing
calculi in the upper pole of the right kidney. Tiny subcentimeter
hypodensity in the posterior mid left kidney is too small to
characterize. There is also an 11 mm water density lesion in the
posterior left kidney consistent with cyst. This needs no dedicated
further follow-up. No solid renal lesion. Urinary bladder is
nondistended and not well assessed.

Stomach/Bowel: Stomach is only minimally distended, unremarkable for
degree of distension. Tiny duodenal diverticulum. No small bowel
obstruction or inflammation. Normal appendix. Moderate volume of
colonic stool mild colonic tortuosity. No colonic wall thickening.
No significant diverticular disease.

Vascular/Lymphatic: Normal caliber abdominal aorta. Patent portal,
splenic, and mesenteric veins. Prominent left adnexal vascularity
and dilatation of the left gonadal vein, 7-no abdominopelvic
adenopathy. 8 mm.

Reproductive: Prominent left adnexal and periuterine vascularity and
dilatation of the ovarian vein. Quiescent ovaries. Uterus is
retroverted

Other: No free air, free fluid, or intra-abdominal fluid collection.
No abdominal wall hernia.

Musculoskeletal: There are no acute or suspicious osseous
abnormalities.
IMPRESSION: 1. Prominent left adnexal and periuterine vascularity and dilatation
of the left gonadal vein, can be seen with pelvic congestion
syndrome in the appropriate clinical setting.
2. Nonobstructing right nephrolithiasis.

## 2023-12-30 ENCOUNTER — Other Ambulatory Visit: Payer: Self-pay | Admitting: Family Medicine

## 2023-12-30 DIAGNOSIS — Z1231 Encounter for screening mammogram for malignant neoplasm of breast: Secondary | ICD-10-CM

## 2024-01-12 ENCOUNTER — Ambulatory Visit (HOSPITAL_BASED_OUTPATIENT_CLINIC_OR_DEPARTMENT_OTHER): Payer: Medicaid Other

## 2025-01-30 ENCOUNTER — Ambulatory Visit: Admitting: Emergency Medicine
